# Patient Record
Sex: Female | Born: 1970 | Race: White | Hispanic: No | Marital: Single | Smoking: Former smoker
Health system: Southern US, Community
[De-identification: ages and names within clinical notes are randomized; demographics above are authoritative.]

## PROBLEM LIST (undated history)

## (undated) DIAGNOSIS — I10 Essential (primary) hypertension: Secondary | ICD-10-CM

## (undated) DIAGNOSIS — F988 Other specified behavioral and emotional disorders with onset usually occurring in childhood and adolescence: Secondary | ICD-10-CM

## (undated) DIAGNOSIS — N938 Other specified abnormal uterine and vaginal bleeding: Secondary | ICD-10-CM

## (undated) DIAGNOSIS — N946 Dysmenorrhea, unspecified: Secondary | ICD-10-CM

## (undated) DIAGNOSIS — F32A Depression, unspecified: Secondary | ICD-10-CM

## (undated) DIAGNOSIS — G2581 Restless legs syndrome: Secondary | ICD-10-CM

## (undated) DIAGNOSIS — R42 Dizziness and giddiness: Secondary | ICD-10-CM

## (undated) DIAGNOSIS — I614 Nontraumatic intracerebral hemorrhage in cerebellum: Secondary | ICD-10-CM

## (undated) DIAGNOSIS — M25559 Pain in unspecified hip: Secondary | ICD-10-CM

## (undated) DIAGNOSIS — F39 Unspecified mood [affective] disorder: Secondary | ICD-10-CM

## (undated) DIAGNOSIS — F419 Anxiety disorder, unspecified: Secondary | ICD-10-CM

## (undated) DIAGNOSIS — G5600 Carpal tunnel syndrome, unspecified upper limb: Secondary | ICD-10-CM

## (undated) DIAGNOSIS — F329 Major depressive disorder, single episode, unspecified: Secondary | ICD-10-CM

## (undated) DIAGNOSIS — M2550 Pain in unspecified joint: Secondary | ICD-10-CM

## (undated) DIAGNOSIS — E7800 Pure hypercholesterolemia, unspecified: Secondary | ICD-10-CM

## (undated) HISTORY — DX: Restless legs syndrome: G25.81

## (undated) HISTORY — PX: BRAIN SURGERY: SHX531

## (undated) HISTORY — DX: Pain in unspecified hip: M25.559

## (undated) HISTORY — DX: Pain in unspecified joint: M25.50

## (undated) HISTORY — DX: Other specified behavioral and emotional disorders with onset usually occurring in childhood and adolescence: F98.8

## (undated) HISTORY — DX: Carpal tunnel syndrome, unspecified upper limb: G56.00

## (undated) HISTORY — DX: Unspecified mood (affective) disorder: F39

## (undated) HISTORY — DX: Dysmenorrhea, unspecified: N94.6

## (undated) HISTORY — DX: Other specified abnormal uterine and vaginal bleeding: N93.8

---

## 2004-12-30 ENCOUNTER — Inpatient Hospital Stay (HOSPITAL_COMMUNITY): Admission: AD | Admit: 2004-12-30 | Discharge: 2005-01-05 | Payer: Self-pay | Admitting: Neurological Surgery

## 2004-12-31 ENCOUNTER — Ambulatory Visit: Payer: Self-pay | Admitting: Oncology

## 2005-02-14 ENCOUNTER — Ambulatory Visit (HOSPITAL_COMMUNITY): Admission: RE | Admit: 2005-02-14 | Discharge: 2005-02-14 | Payer: Self-pay | Admitting: Neurological Surgery

## 2012-03-20 ENCOUNTER — Encounter (HOSPITAL_COMMUNITY): Payer: Self-pay | Admitting: *Deleted

## 2012-03-20 ENCOUNTER — Inpatient Hospital Stay (HOSPITAL_COMMUNITY)
Admission: EM | Admit: 2012-03-20 | Discharge: 2012-03-22 | DRG: 395 | Disposition: A | Discharge status: Home / Self Care | Payer: MEDICAID | Attending: Internal Medicine | Admitting: Internal Medicine

## 2012-03-20 ENCOUNTER — Emergency Department (HOSPITAL_COMMUNITY): Payer: Self-pay

## 2012-03-20 DIAGNOSIS — I10 Essential (primary) hypertension: Secondary | ICD-10-CM | POA: Diagnosis present

## 2012-03-20 DIAGNOSIS — Z23 Encounter for immunization: Secondary | ICD-10-CM

## 2012-03-20 DIAGNOSIS — F3289 Other specified depressive episodes: Secondary | ICD-10-CM | POA: Diagnosis present

## 2012-03-20 DIAGNOSIS — F329 Major depressive disorder, single episode, unspecified: Secondary | ICD-10-CM | POA: Diagnosis present

## 2012-03-20 DIAGNOSIS — F17200 Nicotine dependence, unspecified, uncomplicated: Secondary | ICD-10-CM | POA: Diagnosis present

## 2012-03-20 DIAGNOSIS — E669 Obesity, unspecified: Secondary | ICD-10-CM | POA: Diagnosis present

## 2012-03-20 DIAGNOSIS — E876 Hypokalemia: Secondary | ICD-10-CM | POA: Diagnosis present

## 2012-03-20 DIAGNOSIS — K529 Noninfective gastroenteritis and colitis, unspecified: Secondary | ICD-10-CM | POA: Diagnosis present

## 2012-03-20 DIAGNOSIS — R42 Dizziness and giddiness: Secondary | ICD-10-CM | POA: Diagnosis present

## 2012-03-20 DIAGNOSIS — E7800 Pure hypercholesterolemia, unspecified: Secondary | ICD-10-CM | POA: Diagnosis present

## 2012-03-20 DIAGNOSIS — F411 Generalized anxiety disorder: Secondary | ICD-10-CM | POA: Diagnosis present

## 2012-03-20 DIAGNOSIS — F419 Anxiety disorder, unspecified: Secondary | ICD-10-CM | POA: Diagnosis present

## 2012-03-20 DIAGNOSIS — K625 Hemorrhage of anus and rectum: Secondary | ICD-10-CM | POA: Diagnosis present

## 2012-03-20 DIAGNOSIS — Z79899 Other long term (current) drug therapy: Secondary | ICD-10-CM

## 2012-03-20 DIAGNOSIS — K5289 Other specified noninfective gastroenteritis and colitis: Secondary | ICD-10-CM

## 2012-03-20 DIAGNOSIS — K559 Vascular disorder of intestine, unspecified: Principal | ICD-10-CM | POA: Diagnosis present

## 2012-03-20 DIAGNOSIS — Z6837 Body mass index (BMI) 37.0-37.9, adult: Secondary | ICD-10-CM

## 2012-03-20 HISTORY — DX: Depression, unspecified: F32.A

## 2012-03-20 HISTORY — DX: Essential (primary) hypertension: I10

## 2012-03-20 HISTORY — DX: Dizziness and giddiness: R42

## 2012-03-20 HISTORY — DX: Anxiety disorder, unspecified: F41.9

## 2012-03-20 HISTORY — DX: Pure hypercholesterolemia, unspecified: E78.00

## 2012-03-20 HISTORY — DX: Nontraumatic intracerebral hemorrhage in cerebellum: I61.4

## 2012-03-20 HISTORY — DX: Major depressive disorder, single episode, unspecified: F32.9

## 2012-03-20 LAB — CBC WITH DIFFERENTIAL/PLATELET
Basophils Absolute: 0 10*3/uL (ref 0.0–0.1)
Basophils Relative: 0 % (ref 0–1)
Eosinophils Absolute: 0.1 10*3/uL (ref 0.0–0.7)
Eosinophils Relative: 1 % (ref 0–5)
HCT: 41.1 % (ref 36.0–46.0)
Hemoglobin: 13.9 g/dL (ref 12.0–15.0)
Lymphocytes Relative: 13 % (ref 12–46)
Lymphs Abs: 1.5 10*3/uL (ref 0.7–4.0)
MCH: 32.3 pg (ref 26.0–34.0)
MCHC: 33.8 g/dL (ref 30.0–36.0)
MCV: 95.4 fL (ref 78.0–100.0)
Monocytes Absolute: 0.6 10*3/uL (ref 0.1–1.0)
Monocytes Relative: 5 % (ref 3–12)
Neutro Abs: 9.1 10*3/uL — ABNORMAL HIGH (ref 1.7–7.7)
Neutrophils Relative %: 81 % — ABNORMAL HIGH (ref 43–77)
Platelets: 371 10*3/uL (ref 150–400)
RBC: 4.31 MIL/uL (ref 3.87–5.11)
RDW: 12.1 % (ref 11.5–15.5)
WBC: 11.2 10*3/uL — ABNORMAL HIGH (ref 4.0–10.5)

## 2012-03-20 LAB — COMPREHENSIVE METABOLIC PANEL
ALT: 24 U/L (ref 0–35)
AST: 21 U/L (ref 0–37)
Albumin: 3.7 g/dL (ref 3.5–5.2)
Alkaline Phosphatase: 56 U/L (ref 39–117)
BUN: 10 mg/dL (ref 6–23)
CO2: 26 mEq/L (ref 19–32)
Calcium: 9.3 mg/dL (ref 8.4–10.5)
Chloride: 99 mEq/L (ref 96–112)
Creatinine, Ser: 0.8 mg/dL (ref 0.50–1.10)
GFR calc Af Amer: >90 mL/min (ref >90)
GFR calc non Af Amer: 90 mL/min — ABNORMAL LOW (ref >90)
Glucose, Bld: 96 mg/dL (ref 70–99)
Potassium: 3.1 mEq/L — ABNORMAL LOW (ref 3.5–5.1)
Sodium: 136 mEq/L (ref 135–145)
Total Bilirubin: 0.3 mg/dL (ref 0.3–1.2)
Total Protein: 7.4 g/dL (ref 6.0–8.3)

## 2012-03-20 LAB — URINALYSIS, ROUTINE W REFLEX MICROSCOPIC
Bilirubin Urine: NEGATIVE
Glucose, UA: NEGATIVE mg/dL
Hgb urine dipstick: NEGATIVE
Ketones, ur: NEGATIVE mg/dL
Leukocytes, UA: NEGATIVE
Nitrite: NEGATIVE
Protein, ur: NEGATIVE mg/dL
Specific Gravity, Urine: 1.015 (ref 1.005–1.030)
Urobilinogen, UA: 0.2 mg/dL (ref 0.0–1.0)
pH: 6 (ref 5.0–8.0)

## 2012-03-20 LAB — LIPASE, BLOOD: Lipase: 24 U/L (ref 11–59)

## 2012-03-20 LAB — SAMPLE TO BLOOD BANK

## 2012-03-20 LAB — POCT PREGNANCY, URINE: Preg Test, Ur: NEGATIVE

## 2012-03-20 MED ORDER — NORTRIPTYLINE HCL 25 MG PO CAPS
50.0000 mg | ORAL_CAPSULE | Freq: Every day | ORAL | Status: DC
  Filled 2012-03-20: qty 2

## 2012-03-20 MED ORDER — SODIUM CHLORIDE 0.9 % IV SOLN
1000.0000 mL | INTRAVENOUS | Status: DC
  Administered 2012-03-20: 1000 mL via INTRAVENOUS

## 2012-03-20 MED ORDER — SODIUM CHLORIDE 0.9 % IV SOLN: INTRAVENOUS | Status: DC

## 2012-03-20 MED ORDER — LISINOPRIL 10 MG PO TABS
10.0000 mg | ORAL_TABLET | Freq: Every day | ORAL | Status: DC
  Administered 2012-03-21 – 2012-03-22 (×2): 10 mg via ORAL
  Filled 2012-03-20 (×2): qty 1

## 2012-03-20 MED ORDER — INFLUENZA VIRUS VACC SPLIT PF IM SUSP
0.5000 mL | INTRAMUSCULAR | Status: AC
  Administered 2012-03-21: 0.5 mL via INTRAMUSCULAR
  Filled 2012-03-20: qty 0.5

## 2012-03-20 MED ORDER — CIPROFLOXACIN IN D5W 400 MG/200ML IV SOLN
400.0000 mg | Freq: Two times a day (BID) | INTRAVENOUS | Status: DC
  Administered 2012-03-20 – 2012-03-21 (×2): 400 mg via INTRAVENOUS
  Filled 2012-03-20 (×9): qty 200

## 2012-03-20 MED ORDER — METRONIDAZOLE IN NACL 5-0.79 MG/ML-% IV SOLN: 500.0000 mg | Freq: Two times a day (BID) | INTRAVENOUS | Status: DC

## 2012-03-20 MED ORDER — ONDANSETRON HCL 4 MG/2ML IJ SOLN: 4.0000 mg | INTRAMUSCULAR | Status: DC | PRN

## 2012-03-20 MED ORDER — PNEUMOCOCCAL VAC POLYVALENT 25 MCG/0.5ML IJ INJ
0.5000 mL | INJECTION | INTRAMUSCULAR | Status: AC
Start: 2012-03-21 — End: 2012-03-21
  Administered 2012-03-21: 0.5 mL via INTRAMUSCULAR
  Filled 2012-03-20: qty 0.5

## 2012-03-20 MED ORDER — POTASSIUM CHLORIDE IN NACL 20-0.9 MEQ/L-% IV SOLN
INTRAVENOUS | Status: DC
  Administered 2012-03-20 – 2012-03-21 (×2): via INTRAVENOUS

## 2012-03-20 MED ORDER — CIPROFLOXACIN IN D5W 400 MG/200ML IV SOLN: 400.0000 mg | Freq: Two times a day (BID) | INTRAVENOUS | Status: DC

## 2012-03-20 MED ORDER — MECLIZINE HCL 12.5 MG PO TABS: 25.0000 mg | ORAL_TABLET | Freq: Three times a day (TID) | ORAL | Status: DC | PRN

## 2012-03-20 MED ORDER — ACETAMINOPHEN 650 MG RE SUPP: 650.0000 mg | Freq: Four times a day (QID) | RECTAL | Status: DC | PRN

## 2012-03-20 MED ORDER — CIPROFLOXACIN IN D5W 400 MG/200ML IV SOLN
INTRAVENOUS | Status: AC
  Filled 2012-03-20: qty 200

## 2012-03-20 MED ORDER — ACETAMINOPHEN 325 MG PO TABS: 650.0000 mg | ORAL_TABLET | Freq: Four times a day (QID) | ORAL | Status: DC | PRN

## 2012-03-20 MED ORDER — MORPHINE SULFATE 4 MG/ML IJ SOLN
4.0000 mg | Freq: Once | INTRAMUSCULAR | Status: AC
  Administered 2012-03-20: 4 mg via INTRAVENOUS
  Filled 2012-03-20: qty 1

## 2012-03-20 MED ORDER — HYDROCODONE-ACETAMINOPHEN 5-325 MG PO TABS
1.0000 | ORAL_TABLET | ORAL | Status: DC | PRN
  Administered 2012-03-20 – 2012-03-22 (×5): 1 via ORAL
  Filled 2012-03-20 (×5): qty 1

## 2012-03-20 MED ORDER — METRONIDAZOLE IN NACL 5-0.79 MG/ML-% IV SOLN
500.0000 mg | Freq: Three times a day (TID) | INTRAVENOUS | Status: DC
  Administered 2012-03-20 – 2012-03-21 (×2): 500 mg via INTRAVENOUS
  Filled 2012-03-20 (×12): qty 100

## 2012-03-20 MED ORDER — METRONIDAZOLE IN NACL 5-0.79 MG/ML-% IV SOLN
INTRAVENOUS | Status: AC
  Filled 2012-03-20: qty 200

## 2012-03-20 MED ORDER — IOHEXOL 300 MG/ML  SOLN
100.0000 mL | Freq: Once | INTRAMUSCULAR | Status: AC | PRN
  Administered 2012-03-20: 100 mL via INTRAVENOUS

## 2012-03-20 MED ORDER — ONDANSETRON HCL 4 MG PO TABS: 4.0000 mg | ORAL_TABLET | Freq: Four times a day (QID) | ORAL | Status: DC | PRN

## 2012-03-20 MED ORDER — ALPRAZOLAM 1 MG PO TABS
1.0000 mg | ORAL_TABLET | Freq: Four times a day (QID) | ORAL | Status: DC
  Administered 2012-03-20 – 2012-03-22 (×7): 1 mg via ORAL
  Filled 2012-03-20 (×7): qty 1

## 2012-03-20 MED ORDER — MORPHINE SULFATE 4 MG/ML IJ SOLN
4.0000 mg | INTRAMUSCULAR | Status: DC | PRN
  Administered 2012-03-20: 4 mg via INTRAVENOUS
  Filled 2012-03-20: qty 1

## 2012-03-20 MED ORDER — ONDANSETRON HCL 4 MG/2ML IJ SOLN: 4.0000 mg | Freq: Four times a day (QID) | INTRAMUSCULAR | Status: DC | PRN

## 2012-03-20 MED ORDER — ONDANSETRON HCL 4 MG/2ML IJ SOLN
4.0000 mg | Freq: Once | INTRAMUSCULAR | Status: AC
  Administered 2012-03-20: 4 mg via INTRAVENOUS
  Filled 2012-03-20: qty 2

## 2012-03-20 MED ORDER — NORTRIPTYLINE HCL 25 MG PO CAPS
100.0000 mg | ORAL_CAPSULE | Freq: Every day | ORAL | Status: DC
  Administered 2012-03-20 – 2012-03-21 (×2): 100 mg via ORAL
  Filled 2012-03-20 (×2): qty 4

## 2012-03-20 NOTE — H&P (Signed)
[facility] History and Physical  Annalyce Lanpher YQM:[id number] DOB: [birth year] DOA: 03/20/2012  Referring physician: Dr. Ignacia Palma PCP: Remus Loffler, PA  Specialists:   Chief Complaint: rectal bleeding  HPI: Theresa Mullins is a 41 y.o. female  history of prior intracranial bleeding status post craniotomy in the past. Patient was in her usual state of health when she developed onset of loose stools and associated blood per rectum. She describes it as bright red blood. She was having multiple bowel movements every day and was taking Imodium. Her symptoms progressively got worse. Today she noticed that her bleeding had progressed and she was noticing just a blood in her bowel movement. She has chronic vertigo but does feel slightly more lightheaded. She does not have any chest pain shortness of breath. She does have some lower abdominal pain which somewhat improves with bowel movement. She's not had any recent antibiotics. She does not have any fever. She does not have any sick contacts. She does take BC powders for pain. This is approximately twice a week.  Review of Systems: Pertinent positives as per history of present illness, otherwise negative  Past Medical History  Diagnosis Date  . Cerebellar bleed   . Hypertension   . Depression   . Anxiety   . High cholesterol   . Vertigo    Past Surgical History  Procedure Date  . Brain surgery    Social History:  reports that she has been smoking.  She has never used smokeless tobacco. She reports that she does not drink alcohol or use illicit drugs. Lives at home with her boyfriend, independent with ADLs.  Allergies  Allergen Reactions  . Remeron (Mirtazapine)     Elevated blood pressure   Family history: No family history of inflammatory bowel disease.  Prior to Admission medications   Medication Sig Start Date End Date Taking? Authorizing Provider  ALPRAZolam Prudy Feeler) 1 MG tablet Take 1 mg by mouth 4 (four) times daily.   Yes  Historical Provider, MD  ferrous sulfate 325 (65 FE) MG tablet Take 325 mg by mouth at bedtime.   Yes Historical Provider, MD  gemfibrozil (LOPID) 600 MG tablet Take 600 mg by mouth 2 (two) times daily.   Yes Historical Provider, MD  HYDROcodone-acetaminophen (VICODIN) 5-500 MG per tablet Take 1 tablet by mouth 3 (three) times daily as needed. pain   Yes Historical Provider, MD  lisinopril (PRINIVIL,ZESTRIL) 10 MG tablet Take 10 mg by mouth daily.   Yes Historical Provider, MD  meclizine (ANTIVERT) 25 MG tablet Take 25 mg by mouth 3 (three) times daily as needed. dizziness   Yes Historical Provider, MD  Multiple Vitamin (MULTIVITAMIN) tablet Take 1 tablet by mouth daily.   Yes Historical Provider, MD  nortriptyline (PAMELOR) 50 MG capsule Take 50-100 mg by mouth 2 (two) times daily. Patient takes 1 capsule in the morning and 2 capsules at night   Yes Historical Provider, MD   Physical Exam: Filed Vitals:   03/20/12 1325 03/20/12 1403 03/20/12 1611  BP: 144/96 148/97 149/89  Pulse: 108 94 91  Temp: 97.9 F (36.6 C)    Resp: 18 18   Height: 4\' 7"  (1.397 m)    Weight: 72.576 kg (160 lb)    SpO2: 100% 97% 100%     General: No acute distress  Eyes: Pupils are equal round react to light  ENT: Mucous membranes are moist  Neck: Supple  Cardiovascular: S1, S2, regular rate and rhythm, no pedal edema  Respiratory:  Clear to auscultation bilaterally  Abdomen: Soft, tender in the lower abdomen, bowel sounds are active  Skin: Normal  Musculoskeletal: Deferred  Psychiatric: Normal affect, cooperative with exam  Neurologic: Grossly intact, nonfocal  Labs on Admission:  Basic Metabolic Panel:  Lab 03/20/12 1610  NA 136  K 3.1*  CL 99  CO2 26  GLUCOSE 96  BUN 10  CREATININE 0.80  CALCIUM 9.3  MG --  PHOS --   Liver Function Tests:  Lab 03/20/12 1536  AST 21  ALT 24  ALKPHOS 56  BILITOT 0.3  PROT 7.4  ALBUMIN 3.7    Lab 03/20/12 1536  LIPASE 24  AMYLASE --   No  results found for this basename: AMMONIA:5 in the last 168 hours CBC:  Lab 03/20/12 1536  WBC 11.2*  NEUTROABS 9.1*  HGB 13.9  HCT 41.1  MCV 95.4  PLT 371   Cardiac Enzymes: No results found for this basename: CKTOTAL:5,CKMB:5,CKMBINDEX:5,TROPONINI:5 in the last 168 hours  BNP (last 3 results) No results found for this basename: PROBNP:3 in the last 8760 hours CBG: No results found for this basename: GLUCAP:5 in the last 168 hours  Radiological Exams on Admission: Ct Abdomen Pelvis W Contrast  03/20/2012  *RADIOLOGY REPORT*  Clinical Data: 41 year old female with abdominal and pelvic pain and rectal bleeding.  CT ABDOMEN AND PELVIS WITH CONTRAST  Technique:  Multidetector CT imaging of the abdomen and pelvis was performed following the standard protocol during bolus administration of intravenous contrast.  Contrast: OMNIPAQUE IOHEXOL 300 MG/ML  SOLN  Comparison: 12/31/2004 CT  Findings: The liver, spleen, pancreas, gallbladder, adrenal glands and kidneys are unremarkable.  There is diffuse circumferential wall thickening of the entire sigmoid colon with adjacent inflammation - compatible with colitis. No other bowel abnormalities are identified. The appendix is normal.  No free fluid, enlarged lymph nodes, biliary dilation or abdominal aortic aneurysm identified. There is no evidence of abscess, pneumoperitoneum or bowel obstruction.  The bladder is within normal limits. The uterus and ovaries are unremarkable.  No acute or suspicious bony abnormalities are identified.  IMPRESSION: Colitis of the entire sigmoid colon - likely inflammatory or infectious.  No evidence of pneumoperitoneum, abscess or bowel obstruction.   Original Report Authenticated By: Harmon Pier, M.D.       Assessment/Plan Principal Problem:  *Colitis Active Problems:  Rectal bleeding  Hypokalemia  Hypertension  Anxiety   1. Colitis. Presumed infectious. We will start the patient on ciprofloxacin and  Flagyl. Send for stool cultures as well as Clostridium difficile PCR. She was placed on contact precautions. We will start her on a low fiber diet. 2. Rectal bleeding. Likely secondary to colitis. We will request a GI consultation to see need for flexible sigmoidoscopy. 3. Hypokalemia. Replace. Likely secondary to GI losses. 4. Hypertension. Continue lisinopril. 5. Anxiety. Continue outpatient dose of Xanax.  Code Status: Full code Family Communication: Discussed with patient, no family present Disposition Plan: Discharge home once medically improved  Time spent: 45 minutes  MEMON,JEHANZEB [facility] Pager [phone]  If 7PM-7AM, please contact night-coverage [email or web address] Password [facility] 03/20/2012, 5:49 PM

## 2012-03-20 NOTE — ED Provider Notes (Signed)
History     CSN: [id number]  Arrival date & time 03/20/12  1315   First MD Initiated Contact with Patient 03/20/12 1328      Chief Complaint  Patient presents with  . Rectal Bleeding    (Consider location/radiation/quality/duration/timing/severity/associated sxs/prior treatment) Patient is a 41 y.o. female presenting with hematochezia. The history is provided by the patient. No language interpreter was used.  Rectal Bleeding  The current episode started yesterday. The onset was gradual. Episode frequency: Pt had cramping pain in the left lower abdomen and diarrhea starting yesterday, took Imodium without relief, developed bright red rectal bleeding yesterday evening that persists today. The stool is described as liquid and bloody. There was no prior successful therapy. Ineffective treatments: She took Imodium without relief. Associated symptoms include abdominal pain, diarrhea and coughing. Pertinent negatives include no hematemesis. Associated symptoms comments: Hot and cold chills, did not measure temperature.. Past medical history comments: None. She has received no recent medical care.    Past Medical History  Diagnosis Date  . Cerebellar bleed   . Hypertension   . Depression   . Anxiety   . High cholesterol     Past Surgical History  Procedure Date  . Brain surgery     History reviewed. No pertinent family history.  History  Substance Use Topics  . Smoking status: Current Every Day Smoker  . Smokeless tobacco: Not on file  . Alcohol Use: No    OB History    Grav Para Term Preterm Abortions TAB SAB Ect Mult Living                  Review of Systems  Unable to perform ROS Constitutional: Positive for chills.  HENT: Negative.   Eyes: Negative.   Respiratory: Positive for cough.   Cardiovascular: Negative.   Gastrointestinal: Positive for abdominal pain, diarrhea and hematochezia. Negative for hematemesis.  Genitourinary: Negative.        Is a week late on  her menstrual period.  Skin: Negative.   Neurological: Negative.   Psychiatric/Behavioral: Negative.     Allergies  Remeron  Home Medications   Current Outpatient Rx  Name  Route  Sig  Dispense  Refill  . ALPRAZOLAM 1 MG PO TABS   Oral   Take 1 mg by mouth 4 (four) times daily.         Marland Kitchen FERROUS SULFATE 325 (65 FE) MG PO TABS   Oral   Take 325 mg by mouth at bedtime.         Marland Kitchen GEMFIBROZIL 600 MG PO TABS   Oral   Take 600 mg by mouth 2 (two) times daily.         Marland Kitchen HYDROCODONE-ACETAMINOPHEN 5-500 MG PO TABS   Oral   Take 1 tablet by mouth 3 (three) times daily as needed. pain         . LISINOPRIL 10 MG PO TABS   Oral   Take 10 mg by mouth daily.         Marland Kitchen ONE-DAILY MULTI VITAMINS PO TABS   Oral   Take 1 tablet by mouth daily.         Marland Kitchen NORTRIPTYLINE HCL 50 MG PO CAPS   Oral   Take 50-100 mg by mouth 2 (two) times daily. Patient takes 1 capsule in the morning and 2 capsules at night           BP 144/96  Pulse 108  Temp 97.9 F (36.6 C)  Resp 18  Ht 4\' 7"  (1.397 m)  Wt 160 lb (72.576 kg)  BMI 37.19 kg/m2  SpO2 100%  LMP 02/05/2012  Physical Exam  ED Course  Procedures (including critical care time)   Labs Reviewed  COMPREHENSIVE METABOLIC PANEL  LIPASE, BLOOD  URINALYSIS, ROUTINE W REFLEX MICROSCOPIC  PREGNANCY, URINE  CBC WITH DIFFERENTIAL  SAMPLE TO BLOOD BANK   2:00 PM Pt seen --> physical exam performed.  Lab workup ordered.    3:43 PM No blood work results because specimens had not been taken to lab.  RN taking specimens to lab now.  4:50 PM Results for orders placed during the hospital encounter of 03/20/12  COMPREHENSIVE METABOLIC PANEL      Component Value Range   Sodium 136  135 - 145 mEq/L   Potassium 3.1 (*) 3.5 - 5.1 mEq/L   Chloride 99  96 - 112 mEq/L   CO2 26  19 - 32 mEq/L   Glucose, Bld 96  70 - 99 mg/dL   BUN 10  6 - 23 mg/dL   Creatinine, Ser 0.86  0.50 - 1.10 mg/dL   Calcium 9.3  8.4 - 57.8 mg/dL    Total Protein 7.4  6.0 - 8.3 g/dL   Albumin 3.7  3.5 - 5.2 g/dL   AST 21  0 - 37 U/L   ALT 24  0 - 35 U/L   Alkaline Phosphatase 56  39 - 117 U/L   Total Bilirubin 0.3  0.3 - 1.2 mg/dL   GFR calc non Af Amer 90 (*) >90 mL/min   GFR calc Af Amer >90  >90 mL/min  LIPASE, BLOOD      Component Value Range   Lipase 24  11 - 59 U/L  URINALYSIS, ROUTINE W REFLEX MICROSCOPIC      Component Value Range   Color, Urine YELLOW  YELLOW   APPearance CLEAR  CLEAR   Specific Gravity, Urine 1.015  1.005 - 1.030   pH 6.0  5.0 - 8.0   Glucose, UA NEGATIVE  NEGATIVE mg/dL   Hgb urine dipstick NEGATIVE  NEGATIVE   Bilirubin Urine NEGATIVE  NEGATIVE   Ketones, ur NEGATIVE  NEGATIVE mg/dL   Protein, ur NEGATIVE  NEGATIVE mg/dL   Urobilinogen, UA 0.2  0.0 - 1.0 mg/dL   Nitrite NEGATIVE  NEGATIVE   Leukocytes, UA NEGATIVE  NEGATIVE  CBC WITH DIFFERENTIAL      Component Value Range   WBC 11.2 (*) 4.0 - 10.5 K/uL   RBC 4.31  3.87 - 5.11 MIL/uL   Hemoglobin 13.9  12.0 - 15.0 g/dL   HCT 46.9  62.9 - 52.8 %   MCV 95.4  78.0 - 100.0 fL   MCH 32.3  26.0 - 34.0 pg   MCHC 33.8  30.0 - 36.0 g/dL   RDW 41.3  24.4 - 01.0 %   Platelets 371  150 - 400 K/uL   Neutrophils Relative 81 (*) 43 - 77 %   Neutro Abs 9.1 (*) 1.7 - 7.7 K/uL   Lymphocytes Relative 13  12 - 46 %   Lymphs Abs 1.5  0.7 - 4.0 K/uL   Monocytes Relative 5  3 - 12 %   Monocytes Absolute 0.6  0.1 - 1.0 K/uL   Eosinophils Relative 1  0 - 5 %   Eosinophils Absolute 0.1  0.0 - 0.7 K/uL   Basophils Relative 0  0 - 1 %   Basophils Absolute 0.0  0.0 - 0.1  K/uL  SAMPLE TO BLOOD BANK      Component Value Range   Blood Bank Specimen SAMPLE AVAILABLE FOR TESTING     Sample Expiration 03/23/2012    POCT PREGNANCY, URINE      Component Value Range   Preg Test, Ur NEGATIVE  NEGATIVE   Ct Abdomen Pelvis W Contrast  03/20/2012  *RADIOLOGY REPORT*  Clinical Data: 41 year old female with abdominal and pelvic pain and rectal bleeding.  CT ABDOMEN AND  PELVIS WITH CONTRAST  Technique:  Multidetector CT imaging of the abdomen and pelvis was performed following the standard protocol during bolus administration of intravenous contrast.  Contrast: OMNIPAQUE IOHEXOL 300 MG/ML  SOLN  Comparison: 12/31/2004 CT  Findings: The liver, spleen, pancreas, gallbladder, adrenal glands and kidneys are unremarkable.  There is diffuse circumferential wall thickening of the entire sigmoid colon with adjacent inflammation - compatible with colitis. No other bowel abnormalities are identified. The appendix is normal.  No free fluid, enlarged lymph nodes, biliary dilation or abdominal aortic aneurysm identified. There is no evidence of abscess, pneumoperitoneum or bowel obstruction.  The bladder is within normal limits. The uterus and ovaries are unremarkable.  No acute or suspicious bony abnormalities are identified.  IMPRESSION: Colitis of the entire sigmoid colon - likely inflammatory or infectious.  No evidence of pneumoperitoneum, abscess or bowel obstruction.   Original Report Authenticated By: Harmon Pier, M.D.     CT of abdomen/pelfis shows colitis in the sigmoid colon.  She continues with LLQ pain.  Recommend admission for IV antibiotics.  5:21 PM Discussed with Dr. Kerry Hough --> admit to [facility] to Team 2 to a med-surg bed.   1. Colitis, acute            Carleene Cooper III, MD 03/20/12 1726

## 2012-03-20 NOTE — ED Notes (Signed)
Pt c/o n/v/d that started on Tuesday, has been taking imodium for the diarrhea, states that she noticed "light" blood in her stools and then the bleeding became worse, pt states that she is bleeding like "she is on her period", when she tries to have a bowel movement, denies any previous problems of rectal bleeding.

## 2012-03-21 DIAGNOSIS — F411 Generalized anxiety disorder: Secondary | ICD-10-CM

## 2012-03-21 LAB — COMPREHENSIVE METABOLIC PANEL
ALT: 17 U/L (ref 0–35)
AST: 15 U/L (ref 0–37)
Albumin: 3.1 g/dL — ABNORMAL LOW (ref 3.5–5.2)
Alkaline Phosphatase: 45 U/L (ref 39–117)
Chloride: 102 mEq/L (ref 96–112)
Potassium: 3.4 mEq/L — ABNORMAL LOW (ref 3.5–5.1)
Sodium: 137 mEq/L (ref 135–145)
Total Bilirubin: 0.2 mg/dL — ABNORMAL LOW (ref 0.3–1.2)
Total Protein: 6.1 g/dL (ref 6.0–8.3)

## 2012-03-21 LAB — CBC
HCT: 35.7 % — ABNORMAL LOW (ref 36.0–46.0)
MCH: 32.8 pg (ref 26.0–34.0)
MCHC: 33.9 g/dL (ref 30.0–36.0)
MCV: 96.7 fL (ref 78.0–100.0)
Platelets: 289 10*3/uL (ref 150–400)
RDW: 12.2 % (ref 11.5–15.5)
WBC: 7.6 10*3/uL (ref 4.0–10.5)

## 2012-03-21 MED ORDER — METRONIDAZOLE 500 MG PO TABS
500.0000 mg | ORAL_TABLET | Freq: Three times a day (TID) | ORAL | Status: DC
  Administered 2012-03-21 – 2012-03-22 (×3): 500 mg via ORAL
  Filled 2012-03-21 (×3): qty 1

## 2012-03-21 MED ORDER — POTASSIUM CHLORIDE CRYS ER 20 MEQ PO TBCR
40.0000 meq | EXTENDED_RELEASE_TABLET | Freq: Once | ORAL | Status: AC
  Administered 2012-03-21: 40 meq via ORAL
  Filled 2012-03-21: qty 1

## 2012-03-21 MED ORDER — DIPHENHYDRAMINE HCL 25 MG PO CAPS
50.0000 mg | ORAL_CAPSULE | Freq: Four times a day (QID) | ORAL | Status: DC | PRN
  Administered 2012-03-21 – 2012-03-22 (×2): 50 mg via ORAL
  Filled 2012-03-21 (×2): qty 2

## 2012-03-21 MED ORDER — CIPROFLOXACIN HCL 250 MG PO TABS
500.0000 mg | ORAL_TABLET | Freq: Two times a day (BID) | ORAL | Status: DC
  Administered 2012-03-21 – 2012-03-22 (×2): 500 mg via ORAL
  Filled 2012-03-21 (×2): qty 2

## 2012-03-21 NOTE — Progress Notes (Signed)
Patient c/o of itching, rash was noted on patient's front, back and upper thighs, patient states that she received flu and pneumonia vaccine today, called and spoke with Dr. Orvan Falconer, new orders given for Benadryl

## 2012-03-21 NOTE — Progress Notes (Signed)
UR Chart Review Completed  

## 2012-03-21 NOTE — Care Management Note (Unsigned)
    Page 1 of 1   03/21/2012     2:09:08 PM   CARE MANAGEMENT NOTE 03/21/2012  Patient:  LAPORSHA, HAFELE   Account Number:  [id number]  Date Initiated:  03/21/2012  Documentation initiated by:  Sharrie Rothman  Subjective/Objective Assessment:   Pt admitted from home with colitis. Pt lives with her boyfriend and will return home with him at discharge. Pt stated that she follows up with Prudy Feeler PA of [facility] in [place]. Pt stated that her boyfriend buys her med.     Action/Plan:   [facility] since she does work a part time job. No CM or HH needs noted.   Anticipated DC Date:  03/24/2012   Anticipated DC Plan:  HOME/SELF CARE      DC Planning Services  CM consult      Choice offered to / List presented to:             Status of service:  Completed, signed off Medicare Important Message given?   (If response is "NO", the following Medicare IM given date fields will be blank) Date Medicare IM given:   Date Additional Medicare IM given:    Discharge Disposition:  HOME/SELF CARE  Per UR Regulation:    If discussed at Long Length of Stay Meetings, dates discussed:    Comments:  03/21/12 1410 Arlyss Queen, RN BSN CM

## 2012-03-21 NOTE — Progress Notes (Signed)
[facility]             Progress Note   Subjective: Patient feels significantly better today, abd pain improving, less blood in stools  Objective: Vital signs in last 24 hours: Temp:  [97.7 F (36.5 C)-98.2 F (36.8 C)] 98.2 F (36.8 C) (11/29 0501) Pulse Rate:  [79-100] 79  (11/29 0501) Resp:  [18] 18  (11/29 0501) BP: (107-179)/(75-100) 107/75 mmHg (11/29 0501) SpO2:  [97 %-100 %] 98 % (11/29 0501) Weight:  [72.576 kg (160 lb)] 72.576 kg (160 lb) (11/28 1837) Weight change:  Last BM Date: 03/20/12  Intake/Output from previous day: 11/28 0701 - 11/29 0700 In: -  Out: 400 [Urine:400]     Physical Exam: General: Alert, awake, oriented x3, in no acute distress. HEENT: No bruits, no goiter. Heart: Regular rate and rhythm, without murmurs, rubs, gallops. Lungs: Clear to auscultation bilaterally. Abdomen: soft, tender in LLQ, bs+ Extremities: No clubbing cyanosis or edema with positive pedal pulses. Neuro: Grossly intact, nonfocal.    Lab Results: Basic Metabolic Panel:  Basename 03/21/12 0554 03/20/12 1536  NA 137 136  K 3.4* 3.1*  CL 102 99  CO2 27 26  GLUCOSE 95 96  BUN 4* 10  CREATININE 0.53 0.80  CALCIUM 8.3* 9.3  MG -- --  PHOS -- --   Liver Function Tests:  [facility] 03/21/12 0554 03/20/12 1536  AST 15 21  ALT 17 24  ALKPHOS 45 56  BILITOT 0.2* 0.3  PROT 6.1 7.4  ALBUMIN 3.1* 3.7    Basename 03/20/12 1536  LIPASE 24  AMYLASE --   No results found for this basename: AMMONIA:2 in the last 72 hours CBC:  Basename 03/21/12 0554 03/20/12 1536  WBC 7.6 11.2*  NEUTROABS -- 9.1*  HGB 12.1 13.9  HCT 35.7* 41.1  MCV 96.7 95.4  PLT 289 371   Cardiac Enzymes: No results found for this basename: CKTOTAL:3,CKMB:3,CKMBINDEX:3,TROPONINI:3 in the last 72 hours BNP: No results found for this basename: PROBNP:3 in the last 72 hours D-Dimer: No results found for this basename: DDIMER:2 in the last 72 hours CBG: No results found for  this basename: GLUCAP:6 in the last 72 hours Hemoglobin A1C: No results found for this basename: HGBA1C in the last 72 hours Fasting Lipid Panel: No results found for this basename: CHOL,HDL,LDLCALC,TRIG,CHOLHDL,LDLDIRECT in the last 72 hours Thyroid Function Tests: No results found for this basename: TSH,T4TOTAL,FREET4,T3FREE,THYROIDAB in the last 72 hours Anemia Panel: No results found for this basename: VITAMINB12,FOLATE,FERRITIN,TIBC,IRON,RETICCTPCT in the last 72 hours Coagulation:  Basename 03/21/12 0554  LABPROT 12.7  INR 0.96   Urine Drug Screen: Drugs of Abuse  No results found for this basename: labopia, cocainscrnur, labbenz, amphetmu, thcu, labbarb    Alcohol Level: No results found for this basename: ETH:2 in the last 72 hours Urinalysis:  Basename 03/20/12 1355  COLORURINE YELLOW  LABSPEC 1.015  PHURINE 6.0  GLUCOSEU NEGATIVE  HGBUR NEGATIVE  BILIRUBINUR NEGATIVE  KETONESUR NEGATIVE  PROTEINUR NEGATIVE  UROBILINOGEN 0.2  NITRITE NEGATIVE  LEUKOCYTESUR NEGATIVE   No results found for this or any previous visit (from the past 240 hour(s)).  Studies/Results: Ct Abdomen Pelvis W Contrast  03/20/2012  *RADIOLOGY REPORT*  Clinical Data: 41 year old female with abdominal and pelvic pain and rectal bleeding.  CT ABDOMEN AND PELVIS WITH CONTRAST  Technique:  Multidetector CT imaging of the abdomen and pelvis was performed following the standard protocol during bolus administration of intravenous contrast.  Contrast: OMNIPAQUE IOHEXOL 300 MG/ML  SOLN  Comparison: 12/31/2004 CT  Findings: The liver, spleen, pancreas, gallbladder, adrenal glands and kidneys are unremarkable.  There is diffuse circumferential wall thickening of the entire sigmoid colon with adjacent inflammation - compatible with colitis. No other bowel abnormalities are identified. The appendix is normal.  No free fluid, enlarged lymph nodes, biliary dilation or abdominal aortic aneurysm identified.  There is no evidence of abscess, pneumoperitoneum or bowel obstruction.  The bladder is within normal limits. The uterus and ovaries are unremarkable.  No acute or suspicious bony abnormalities are identified.  IMPRESSION: Colitis of the entire sigmoid colon - likely inflammatory or infectious.  No evidence of pneumoperitoneum, abscess or bowel obstruction.   Original Report Authenticated By: Harmon Pier, M.D.     Medications: Scheduled Meds:   . ALPRAZolam  1 mg Oral QID  . [COMPLETED] influenza  inactive virus vaccine  0.5 mL Intramuscular Tomorrow-1000  . lisinopril  10 mg Oral Daily  . [COMPLETED] morphine  4 mg Intravenous Once  . nortriptyline  100 mg Oral QHS  . nortriptyline  50 mg Oral Daily  . [COMPLETED] ondansetron  4 mg Intravenous Once  . [COMPLETED] pneumococcal 23 valent vaccine  0.5 mL Intramuscular Tomorrow-1000  . potassium chloride  40 mEq Oral Once  . [DISCONTINUED] ciprofloxacin  400 mg Intravenous Q12H  . [DISCONTINUED] ciprofloxacin  400 mg Intravenous Q12H  . [DISCONTINUED] metroNIDAZOLE  500 mg Intravenous Q12H  . [DISCONTINUED] metronidazole  500 mg Intravenous Q8H   Continuous Infusions:   . 0.9 % NaCl with KCl 20 mEq / L 75 mL/hr at 03/20/12 2021  . [DISCONTINUED] sodium chloride 1,000 mL (03/20/12 1418)  . [DISCONTINUED] sodium chloride     PRN Meds:.acetaminophen, acetaminophen, HYDROcodone-acetaminophen, [COMPLETED] iohexol, meclizine, morphine, ondansetron (ZOFRAN) IV, ondansetron, [DISCONTINUED] ondansetron (ZOFRAN) IV  Assessment/Plan:  Principal Problem:  *Colitis Active Problems:  Rectal bleeding  Hypokalemia  Hypertension  Anxiety  1. Colitis.  Ischemic vs. Infectious. Continue antibiotics and switch to po. Appreciate GI assistance.  Advance diet today.  Will plan on following up with GI as an outpatient to consider colonoscopy. Follow up stool studies  2. Hypokalemia, replace  3. Hypertension, stable  4. Dispo.  Likely discharge home  in am if tolerating solid diet.  Time spent coordinating care:   LOS: 1 day   Spero Gunnels [facility] Pager: [phone] 03/21/2012, 1:49 PM

## 2012-03-21 NOTE — Consult Note (Signed)
Referring Provider:  Kerry Hough Primary Care Physician:  Remus Loffler, PA Primary Gastroenterologist:  Dr.  Jena Gauss  Reason for Consultation:  Colitis   HPI:   Pleasant 41 year old lady admitted hospital yesterday with recent nonbloody diarrhea followed by gross blood per rectum. States she was in her usual state of good health until 5 days prior to admission when she started to feel a little bad with malaise weakness chills but no fever. Nonbloody diarrhea followed - lasted one to 2 days along with lower abdominal cramps. This was followed by gross blood per rectum. Minimally elevated white count 11,000 range; overall hemoglobin has remained good. Over the past 24 hours, bloody diarrhea has subsided along with the abdominal cramps. Patient wants to go home. She's been tolerating a clear liquid diet. Stool studies have been ordered. She is already been started on Cipro and Flagyl.  In addition to being a smoker, she has been using quite a bit of BC powders recently for various aches and pains. No family history of inflammatory bowel disease or colitis/CRC. No sick contacts. No recent antibiotics.  I have reviewed the CT scan with Dr. Kearney Hard. Inflammatory changes of the colon are well demarcated and limited to the sigmoid segment with a normal appearing more proximal colon and rectum.   Past Medical History  Diagnosis Date  . Cerebellar bleed   . Hypertension   . Depression   . Anxiety   . High cholesterol   . Vertigo     Past Surgical History  Procedure Date  . Brain surgery     Prior to Admission medications   Medication Sig Start Date End Date Taking? Authorizing Provider  ALPRAZolam Prudy Feeler) 1 MG tablet Take 1 mg by mouth 4 (four) times daily.   Yes Historical Provider, MD  ferrous sulfate 325 (65 FE) MG tablet Take 325 mg by mouth at bedtime.   Yes Historical Provider, MD  gemfibrozil (LOPID) 600 MG tablet Take 600 mg by mouth 2 (two) times daily.   Yes Historical Provider, MD    HYDROcodone-acetaminophen (VICODIN) 5-500 MG per tablet Take 1 tablet by mouth 3 (three) times daily as needed. pain   Yes Historical Provider, MD  lisinopril (PRINIVIL,ZESTRIL) 10 MG tablet Take 10 mg by mouth daily.   Yes Historical Provider, MD  meclizine (ANTIVERT) 25 MG tablet Take 25 mg by mouth 3 (three) times daily as needed. dizziness   Yes Historical Provider, MD  Multiple Vitamin (MULTIVITAMIN) tablet Take 1 tablet by mouth daily.   Yes Historical Provider, MD  nortriptyline (PAMELOR) 50 MG capsule Take 50-100 mg by mouth 2 (two) times daily. Patient takes 1 capsule in the morning and 2 capsules at night   Yes Historical Provider, MD    Current Facility-Administered Medications  Medication Dose Route Frequency Provider Last Rate Last Dose  . 0.9 %  sodium chloride infusion   Intravenous Continuous Carleene Cooper III, MD      . 0.9 % NaCl with KCl 20 mEq/ L  infusion   Intravenous Continuous Erick Blinks, MD 75 mL/hr at 03/20/12 2021    . acetaminophen (TYLENOL) tablet 650 mg  650 mg Oral Q6H PRN Erick Blinks, MD       Or  . acetaminophen (TYLENOL) suppository 650 mg  650 mg Rectal Q6H PRN Erick Blinks, MD      . ALPRAZolam Prudy Feeler) tablet 1 mg  1 mg Oral QID Erick Blinks, MD   1 mg at 03/21/12 1000  . ciprofloxacin (CIPRO) IVPB  400 mg  400 mg Intravenous Q12H Erick Blinks, MD   400 mg at 03/21/12 0800  . HYDROcodone-acetaminophen (NORCO/VICODIN) 5-325 MG per tablet 1 tablet  1 tablet Oral Q4H PRN Erick Blinks, MD   1 tablet at 03/21/12 1146  . [COMPLETED] influenza  inactive virus vaccine (FLUZONE/FLUARIX) injection 0.5 mL  0.5 mL Intramuscular Tomorrow-1000 Erick Blinks, MD   0.5 mL at 03/21/12 1000  . [COMPLETED] iohexol (OMNIPAQUE) 300 MG/ML solution 100 mL  100 mL Intravenous Once PRN Medication Radiologist, MD   100 mL at 03/20/12 1637  . lisinopril (PRINIVIL,ZESTRIL) tablet 10 mg  10 mg Oral Daily Erick Blinks, MD   10 mg at 03/21/12 1000  . meclizine (ANTIVERT)  tablet 25 mg  25 mg Oral TID PRN Erick Blinks, MD      . metroNIDAZOLE (FLAGYL) IVPB 500 mg  500 mg Intravenous Q8H Erick Blinks, MD   500 mg at 03/21/12 0524  . [COMPLETED] morphine 4 MG/ML injection 4 mg  4 mg Intravenous Once Carleene Cooper III, MD   4 mg at 03/20/12 1615  . morphine 4 MG/ML injection 4 mg  4 mg Intravenous Q4H PRN Carleene Cooper III, MD   4 mg at 03/20/12 2035  . nortriptyline (PAMELOR) capsule 100 mg  100 mg Oral QHS Erick Blinks, MD   100 mg at 03/20/12 2145  . nortriptyline (PAMELOR) capsule 50 mg  50 mg Oral Daily Erick Blinks, MD      . [COMPLETED] ondansetron (ZOFRAN) injection 4 mg  4 mg Intravenous Once Carleene Cooper III, MD   4 mg at 03/20/12 1615  . ondansetron (ZOFRAN) tablet 4 mg  4 mg Oral Q6H PRN Erick Blinks, MD       Or  . ondansetron (ZOFRAN) injection 4 mg  4 mg Intravenous Q6H PRN Erick Blinks, MD      . pneumococcal 23 valent vaccine (PNU-IMMUNE) injection 0.5 mL  0.5 mL Intramuscular Tomorrow-1000 Erick Blinks, MD      . [DISCONTINUED] 0.9 %  sodium chloride infusion  1,000 mL Intravenous Continuous Carleene Cooper III, MD 125 mL/hr at 03/20/12 1418 1,000 mL at 03/20/12 1418  . [DISCONTINUED] ciprofloxacin (CIPRO) IVPB 400 mg  400 mg Intravenous Q12H Carleene Cooper III, MD      . [DISCONTINUED] metroNIDAZOLE (FLAGYL) IVPB 500 mg  500 mg Intravenous Q12H Carleene Cooper III, MD      . [DISCONTINUED] ondansetron [facility]) injection 4 mg  4 mg Intravenous Q4H PRN Carleene Cooper III, MD        Allergies as of 03/20/2012 - Review Complete 03/20/2012  Allergen Reaction Noted  . Remeron (mirtazapine)  03/20/2012    History reviewed. No pertinent family history.  History   Social History  . Marital Status: Single    Spouse Name: N/A    Number of Children: N/A  . Years of Education: N/A   Occupational History  . Not on file.   Social History Main Topics  . Smoking status: Current Every Day Smoker -- 0.5 packs/day  . Smokeless tobacco: Never  Used  . Alcohol Use: No  . Drug Use: No  . Sexually Active:    Other Topics Concern  . Not on file   Social History Narrative  . No narrative on file    Review of Systems: Gen:  weight loss, and sleep disorder CV: Denies chest pain, angina, palpitations, syncope, orthopnea, PND, peripheral edema, and claudication. Resp: Denies dyspnea at rest, dyspnea with exercise, cough, sputum, wheezing, coughing  up blood, and pleurisy. GI: Denies vomiting blood, jaundice, and fecal incontinence.   Denies dysphagia or odynophagia. Derm: Denies rash, itching, dry skin, hives, moles, warts, or unhealing ulcers.  Psych: Denies depression, anxiety, memory loss, suicidal ideation, hallucinations, paranoia, and confusion. Heme: Denies bruising, bleeding, and enlarged lymph nodes.   Physical Exam: Vital signs in last 24 hours: Temp:  [97.7 F (36.5 C)-98.2 F (36.8 C)] 98.2 F (36.8 C) (11/29 0501) Pulse Rate:  [79-108] 79  (11/29 0501) Resp:  [18] 18  (11/29 0501) BP: (107-179)/(75-100) 107/75 mmHg (11/29 0501) SpO2:  [97 %-100 %] 98 % (11/29 0501) Weight:  [160 lb (72.576 kg)] 160 lb (72.576 kg) (11/28 1837) Last BM Date: 03/20/12 General:   Short stature. Alert,  Well-developed, well-nourished, pleasant and cooperative in NAD Head:  Normocephalic and atraumatic. Eyes:  Sclera clear, no icterus.   Conjunctiva pink. Ears:  Normal auditory acuity. Nose:  No deformity, discharge,  or lesions. Mouth:  No deformity or lesions, dentition normal. Neck:  Supple; no masses or thyromegaly. Lungs:  Clear throughout to auscultation.   No wheezes, crackles, or rhonchi. No acute distress. Heart:  Regular rate and rhythm; no murmurs, clicks, rubs,  or gallops. Abdomen:  Obese. Positive bowel sounds. Localized tenderness left lower quadrant without mass or hepatosplenomegaly. No rebound or guarding.  Msk:  Symmetrical without gross deformities. Normal posture. Pulses:  Normal pulses noted. Extremities:   Without clubbing or edema. Neurologic:  Alert and  oriented x4;  grossly normal neurologically. Skin:  Intact without significant lesions or rashes. Cervical Nodes:  No significant cervical adenopathy. Psych:  Alert and cooperative. Normal mood and affect.  Intake/Output from previous day: 11/28 0701 - 11/29 0700 In: -  Out: 400 [Urine:400] Intake/Output this shift:    Lab Results:  Basename 03/21/12 0554 03/20/12 1536  WBC 7.6 11.2*  HGB 12.1 13.9  HCT 35.7* 41.1  PLT 289 371   BMET  Basename 03/21/12 0554 03/20/12 1536  NA 137 136  K 3.4* 3.1*  CL 102 99  CO2 27 26  GLUCOSE 95 96  BUN 4* 10  CREATININE 0.53 0.80  CALCIUM 8.3* 9.3   LFT  Basename 03/21/12 0554  PROT 6.1  ALBUMIN 3.1*  AST 15  ALT 17  ALKPHOS 45  BILITOT 0.2*  BILIDIR --  IBILI --   PT/INR  Basename 03/21/12 0554  LABPROT 12.7  INR 0.96   Impression: Pleasant 41 year old lady with the acute onset malaise chills and nonbloody diarrhea followed by more or less grossly bloody stools this week which have tapered off significantly over the past 24 hours. She has a localized segmental colitis on CT. She is a smoker. She has been using quite a bit of aspirin recently.  Although she does have some atypical features, the scenario including CT findings is most consistent with segmental or ischemic colitis. I doubt this is new onset inflammatory bowel disease. Likewise, given the well demarcated nature of the inflammation on CT, unlikely to be infection although this possibility remains in the differential. Tobacco use puts her at risk for ischemia. Superimposed aspirin use could insight/or exacerbate colitis from any cause.  Clinically, she appears to be significantly improved already.  Recommendations: The patient has been already started on IV Cipro and Flagyl; At this point, would transition her to oral therapy and complete a ten-day course empirically. Follow up on stool studies.  Smoking cessation  recommended. Limit NSAID use, particularly in this setting.  Given significant morphological changes  in her colon seen on the CT this admission, will plan to see her back in the office in the coming weeks and likely offer her a colonoscopy to cover all bases at that time.  I'd like to thank the hospitalist service for allowing me to see this nice this lady today.

## 2012-03-22 LAB — CLOSTRIDIUM DIFFICILE BY PCR: Toxigenic C. Difficile by PCR: NEGATIVE

## 2012-03-22 MED ORDER — CIPROFLOXACIN HCL 500 MG PO TABS: 500.0000 mg | ORAL_TABLET | Freq: Two times a day (BID) | ORAL | Status: DC

## 2012-03-22 MED ORDER — EPINEPHRINE 0.3 MG/0.3ML IJ DEVI: 0.3000 mg | Freq: Once | INTRAMUSCULAR | Status: DC

## 2012-03-22 MED ORDER — METRONIDAZOLE 500 MG PO TABS: 500.0000 mg | ORAL_TABLET | Freq: Three times a day (TID) | ORAL | Status: DC

## 2012-03-22 MED ORDER — DIPHENHYDRAMINE HCL 50 MG PO CAPS: 50.0000 mg | ORAL_CAPSULE | Freq: Four times a day (QID) | ORAL | Status: DC | PRN

## 2012-03-22 NOTE — Progress Notes (Signed)
Subjective: Tolerating basically a regular diet. One semi-formed slightly old bloody stool this a.m. Overall, states she's feeling better wants to go home.  Objective: Vital signs in last 24 hours: Temp:  [98.2 F (36.8 C)-98.5 F (36.9 C)] 98.4 F (36.9 C) (11/30 0418) Pulse Rate:  [77-109] 109  (11/30 0418) Resp:  [18] 18  (11/30 0418) BP: (110-114)/(74-78) 114/78 mmHg (11/30 0418) SpO2:  [97 %-100 %] 98 % (11/30 0418) Last BM Date: 03/21/12 General:   Alert,  Well-developed, well-nourished, pleasant and cooperative in NAD Abdomen:  Obese. Positive bowel sounds; soft mild left lower quadrant tenderness to palpation. No mass or organomegaly.   Extremities:  Without clubbing or edema.    Intake/Output from previous day: 11/29 0701 - 11/30 0700 In: 2590.3 [P.O.:240; I.V.:2350.3] Out: 800 [Urine:800] Intake/Output this shift:    Lab Results:  Basename 03/21/12 0554 03/20/12 1536  WBC 7.6 11.2*  HGB 12.1 13.9  HCT 35.7* 41.1  PLT 289 371   BMET  Basename 03/21/12 0554 03/20/12 1536  NA 137 136  K 3.4* 3.1*  CL 102 99  CO2 27 26  GLUCOSE 95 96  BUN 4* 10  CREATININE 0.53 0.80  CALCIUM 8.3* 9.3   LFT  Basename 03/21/12 0554  PROT 6.1  ALBUMIN 3.1*  AST 15  ALT 17  ALKPHOS 45  BILITOT 0.2*  BILIDIR --  IBILI --   PT/INR  Basename 03/21/12 0554  LABPROT 12.7  INR 0.96   Studies/Results: Ct Abdomen Pelvis W Contrast  03/20/2012  *RADIOLOGY REPORT*  Clinical Data: 41 year old female with abdominal and pelvic pain and rectal bleeding.  CT ABDOMEN AND PELVIS WITH CONTRAST  Technique:  Multidetector CT imaging of the abdomen and pelvis was performed following the standard protocol during bolus administration of intravenous contrast.  Contrast: OMNIPAQUE IOHEXOL 300 MG/ML  SOLN  Comparison: 12/31/2004 CT  Findings: The liver, spleen, pancreas, gallbladder, adrenal glands and kidneys are unremarkable.  There is diffuse circumferential wall thickening of the  entire sigmoid colon with adjacent inflammation - compatible with colitis. No other bowel abnormalities are identified. The appendix is normal.  No free fluid, enlarged lymph nodes, biliary dilation or abdominal aortic aneurysm identified. There is no evidence of abscess, pneumoperitoneum or bowel obstruction.  The bladder is within normal limits. The uterus and ovaries are unremarkable.  No acute or suspicious bony abnormalities are identified.  IMPRESSION: Colitis of the entire sigmoid colon - likely inflammatory or infectious.  No evidence of pneumoperitoneum, abscess or bowel obstruction.   Original Report Authenticated By: Harmon Pier, M.D.    Impression: Acute segmental colitis-more likely ischemic and infectious in etiology. Doing much better already. Hemoglobin remains normal. Rapid turnaround more consistent with ischemia.  Recommendations: Home soon per hospitalist service. Complete course of antibiotics.  Office visit with [place] in about 4 weeks. Smoking cessation recommended.

## 2012-03-22 NOTE — Discharge Summary (Signed)
Physician Discharge Summary  Theresa Mullins BJY:[id number] DOB: [birth year] DOA: 03/20/2012  PCP: Remus Loffler, PA  Admit date: 03/20/2012 Discharge date: 03/22/2012  Time spent: 40 minutes  Recommendations for Outpatient Follow-up:  1. Follow up with GI in 4 weeks 2. Follow up with PCP in 2 weeks  Discharge Diagnoses:  Principal Problem:  *Colitis, likely ischemic Active Problems:  Rectal bleeding  Hypokalemia  Hypertension  Anxiety   Discharge Condition: improved  Diet recommendation: low salt  Filed Weights   03/20/12 1325 03/20/12 1837  Weight: 72.576 kg (160 lb) 72.576 kg (160 lb)    History of present illness:  history of prior intracranial bleeding status post craniotomy in the past. Patient was in her usual state of health when she developed onset of loose stools and associated blood per rectum. She describes it as bright red blood. She was having multiple bowel movements every day and was taking Imodium. Her symptoms progressively got worse. Today she noticed that her bleeding had progressed and she was noticing just a blood in her bowel movement. She has chronic vertigo but does feel slightly more lightheaded. She does not have any chest pain shortness of breath. She does have some lower abdominal pain which somewhat improves with bowel movement. She's not had any recent antibiotics. She does not have any fever. She does not have any sick contacts. She does take BC powders for pain. This is approximately twice a week.   Hospital Course:  This lady was admitted for abdominal pain and rectal bleeding.  CT indicated a sigmoid colitis. She was started empirically on cipro and flagyl.  Stool c diff was found to be negative.  She was seen by GI and it was felt that this was more likely an ischemic colitis.  Patient had rapid improvement in her symptoms.  Recommendations were to complete a course of antibiotics and follow up with GI service as an outpatient to consider  colonoscopy.   She did have outbreak of rash while in the hospital.  It is difficult to pinpoint the exact cause of rash.  She did receive a pneumonia vaccination this morning, and she has been on antibiotics for last 2 days.  She has been given benadryl for symptomatic relief and feels she is improving.  It is unlikely that this is being caused by antibiotics.  In any case, she has been advised to continue antibiotics, unless her rash continues to get worse, she has difficulty breathing, wheezing or feels her face/tongue swelling.  In that case she has been asked to stop antibiotics. She will also be given an epi pen incase she has a significant reaction.  Procedures:  none  Consultations:  GI, Dr. Jena Gauss  Discharge Exam: Filed Vitals:   03/21/12 0501 03/21/12 1510 03/21/12 2053 03/22/12 0418  BP: 107/75 110/74 112/75 114/78  Pulse: 79 77 94 109  Temp: 98.2 F (36.8 C) 98.5 F (36.9 C) 98.2 F (36.8 C) 98.4 F (36.9 C)  TempSrc: Oral Oral Oral Oral  Resp: 18 18 18 18   Height:      Weight:      SpO2: 98% 97% 100% 98%    General: NAD Cardiovascular: s1, s2 rrr Respiratory: cta b  Discharge Instructions  Discharge Orders    Future Orders Please Complete By Expires   Diet - low sodium heart healthy      Increase activity slowly      Call MD for:  temperature >100.4      Call MD  for:  difficulty breathing, headache or visual disturbances      Call MD for:  redness, tenderness, or signs of infection (pain, swelling, redness, odor or green/yellow discharge around incision site)      Call MD for:  severe uncontrolled pain          Medication List     As of 03/22/2012 11:34 AM    TAKE these medications         ALPRAZolam 1 MG tablet   Commonly known as: XANAX   Take 1 mg by mouth 4 (four) times daily.      ciprofloxacin 500 MG tablet   Commonly known as: CIPRO   Take 1 tablet (500 mg total) by mouth 2 (two) times daily.      diphenhydrAMINE 50 MG capsule   Commonly  known as: BENADRYL   Take 1 capsule (50 mg total) by mouth every 6 (six) hours as needed for itching.      EPINEPHrine 0.3 mg/0.3 mL Devi   Commonly known as: EPI-PEN   Inject 0.3 mLs (0.3 mg total) into the muscle once.      ferrous sulfate 325 (65 FE) MG tablet   Take 325 mg by mouth at bedtime.      gemfibrozil 600 MG tablet   Commonly known as: LOPID   Take 600 mg by mouth 2 (two) times daily.      HYDROcodone-acetaminophen 5-500 MG per tablet   Commonly known as: VICODIN   Take 1 tablet by mouth 3 (three) times daily as needed. pain      lisinopril 10 MG tablet   Commonly known as: PRINIVIL,ZESTRIL   Take 10 mg by mouth daily.      meclizine 25 MG tablet   Commonly known as: ANTIVERT   Take 25 mg by mouth 3 (three) times daily as needed. dizziness      metroNIDAZOLE 500 MG tablet   Commonly known as: FLAGYL   Take 1 tablet (500 mg total) by mouth every 8 (eight) hours.      multivitamin tablet   Take 1 tablet by mouth daily.      nortriptyline 50 MG capsule   Commonly known as: PAMELOR   Take 50-100 mg by mouth 2 (two) times daily. Patient takes 1 capsule in the morning and 2 capsules at night           Follow-up Information    Follow up with Eula Listen, MD. Schedule an appointment as soon as possible for a visit in 4 weeks.   Contact information:   [address] [place] [address] [phone]       Follow up with Remus Loffler, PA. Schedule an appointment as soon as possible for a visit in 2 weeks.   Contact information:   [address] [place] [address] [phone]           The results of significant diagnostics from this hospitalization (including imaging, microbiology, ancillary and laboratory) are listed below for reference.    Significant Diagnostic Studies: Ct Abdomen Pelvis W Contrast  03/20/2012  *RADIOLOGY REPORT*  Clinical Data: 41 year old female with abdominal and pelvic pain and rectal  bleeding.  CT ABDOMEN AND PELVIS WITH CONTRAST  Technique:  Multidetector CT imaging of the abdomen and pelvis was performed following the standard protocol during bolus administration of intravenous contrast.  Contrast: OMNIPAQUE IOHEXOL 300 MG/ML  SOLN  Comparison: 12/31/2004 CT  Findings: The liver,  spleen, pancreas, gallbladder, adrenal glands and kidneys are unremarkable.  There is diffuse circumferential wall thickening of the entire sigmoid colon with adjacent inflammation - compatible with colitis. No other bowel abnormalities are identified. The appendix is normal.  No free fluid, enlarged lymph nodes, biliary dilation or abdominal aortic aneurysm identified. There is no evidence of abscess, pneumoperitoneum or bowel obstruction.  The bladder is within normal limits. The uterus and ovaries are unremarkable.  No acute or suspicious bony abnormalities are identified.  IMPRESSION: Colitis of the entire sigmoid colon - likely inflammatory or infectious.  No evidence of pneumoperitoneum, abscess or bowel obstruction.   Original Report Authenticated By: Harmon Pier, M.D.     Microbiology: Recent Results (from the past 240 hour(s))  CLOSTRIDIUM DIFFICILE BY PCR     Status: Normal   Collection Time   03/22/12  8:35 AM      Component Value Range Status Comment   C difficile by pcr NEGATIVE  NEGATIVE Final      Labs: Basic Metabolic Panel:  Lab 03/21/12 5784 03/20/12 1536  NA 137 136  K 3.4* 3.1*  CL 102 99  CO2 27 26  GLUCOSE 95 96  BUN 4* 10  CREATININE 0.53 0.80  CALCIUM 8.3* 9.3  MG -- --  PHOS -- --   Liver Function Tests:  Lab 03/21/12 0554 03/20/12 1536  AST 15 21  ALT 17 24  ALKPHOS 45 56  BILITOT 0.2* 0.3  PROT 6.1 7.4  ALBUMIN 3.1* 3.7    Lab 03/20/12 1536  LIPASE 24  AMYLASE --   No results found for this basename: AMMONIA:5 in the last 168 hours CBC:  Lab 03/21/12 0554 03/20/12 1536  WBC 7.6 11.2*  NEUTROABS -- 9.1*  HGB 12.1 13.9  HCT 35.7* 41.1  MCV  96.7 95.4  PLT 289 371   Cardiac Enzymes: No results found for this basename: CKTOTAL:5,CKMB:5,CKMBINDEX:5,TROPONINI:5 in the last 168 hours BNP: BNP (last 3 results) No results found for this basename: PROBNP:3 in the last 8760 hours CBG: No results found for this basename: GLUCAP:5 in the last 168 hours     Signed:  Nyeshia Mysliwiec  [facility] 03/22/2012, 11:34 AM

## 2012-03-22 NOTE — Progress Notes (Signed)
Pt d/c'd home via personal vehicle driven by friend. Currently voices no c/o pain or discomfort. Discharge instructions and meds reviewed with pt with good understanding.

## 2012-03-24 ENCOUNTER — Telehealth: Payer: Self-pay | Admitting: *Deleted

## 2012-03-24 ENCOUNTER — Encounter: Payer: Self-pay | Admitting: *Deleted

## 2012-03-24 LAB — OCCULT BLOOD, POC DEVICE: Fecal Occult Bld: POSITIVE

## 2012-03-24 NOTE — Telephone Encounter (Signed)
error 

## 2012-03-26 LAB — STOOL CULTURE

## 2013-01-16 ENCOUNTER — Other Ambulatory Visit: Payer: Self-pay

## 2013-10-15 ENCOUNTER — Encounter (HOSPITAL_COMMUNITY): Payer: Self-pay | Admitting: Emergency Medicine

## 2013-10-15 ENCOUNTER — Emergency Department (HOSPITAL_COMMUNITY)
Admission: EM | Admit: 2013-10-15 | Discharge: 2013-10-15 | Disposition: A | Discharge status: Home / Self Care | Payer: BC Managed Care – PPO | Attending: Emergency Medicine | Admitting: Emergency Medicine

## 2013-10-15 DIAGNOSIS — R079 Chest pain, unspecified: Secondary | ICD-10-CM | POA: Insufficient documentation

## 2013-10-15 DIAGNOSIS — F411 Generalized anxiety disorder: Secondary | ICD-10-CM | POA: Insufficient documentation

## 2013-10-15 DIAGNOSIS — E7800 Pure hypercholesterolemia, unspecified: Secondary | ICD-10-CM | POA: Insufficient documentation

## 2013-10-15 DIAGNOSIS — F17200 Nicotine dependence, unspecified, uncomplicated: Secondary | ICD-10-CM | POA: Insufficient documentation

## 2013-10-15 DIAGNOSIS — F3289 Other specified depressive episodes: Secondary | ICD-10-CM | POA: Insufficient documentation

## 2013-10-15 DIAGNOSIS — F329 Major depressive disorder, single episode, unspecified: Secondary | ICD-10-CM | POA: Insufficient documentation

## 2013-10-15 DIAGNOSIS — Z791 Long term (current) use of non-steroidal anti-inflammatories (NSAID): Secondary | ICD-10-CM | POA: Insufficient documentation

## 2013-10-15 DIAGNOSIS — I10 Essential (primary) hypertension: Secondary | ICD-10-CM | POA: Insufficient documentation

## 2013-10-15 DIAGNOSIS — R42 Dizziness and giddiness: Secondary | ICD-10-CM

## 2013-10-15 DIAGNOSIS — R112 Nausea with vomiting, unspecified: Secondary | ICD-10-CM | POA: Insufficient documentation

## 2013-10-15 DIAGNOSIS — R259 Unspecified abnormal involuntary movements: Secondary | ICD-10-CM | POA: Insufficient documentation

## 2013-10-15 DIAGNOSIS — R252 Cramp and spasm: Secondary | ICD-10-CM

## 2013-10-15 DIAGNOSIS — Z79899 Other long term (current) drug therapy: Secondary | ICD-10-CM | POA: Insufficient documentation

## 2013-10-15 LAB — COMPREHENSIVE METABOLIC PANEL
ALBUMIN: 3.8 g/dL (ref 3.5–5.2)
ALK PHOS: 56 U/L (ref 39–117)
ALT: 15 U/L (ref 0–35)
AST: 17 U/L (ref 0–37)
BUN: 8 mg/dL (ref 6–23)
CALCIUM: 9.3 mg/dL (ref 8.4–10.5)
CO2: 27 mEq/L (ref 19–32)
Chloride: 101 mEq/L (ref 96–112)
Creatinine, Ser: 0.76 mg/dL (ref 0.50–1.10)
GFR calc Af Amer: >90 mL/min (ref >90)
GFR calc non Af Amer: >90 mL/min (ref >90)
Glucose, Bld: 101 mg/dL — ABNORMAL HIGH (ref 70–99)
POTASSIUM: 3.8 meq/L (ref 3.7–5.3)
SODIUM: 141 meq/L (ref 137–147)
TOTAL PROTEIN: 7.3 g/dL (ref 6.0–8.3)
Total Bilirubin: 0.2 mg/dL — ABNORMAL LOW (ref 0.3–1.2)

## 2013-10-15 LAB — CBC WITH DIFFERENTIAL/PLATELET
Basophils Absolute: 0 10*3/uL (ref 0.0–0.1)
Basophils Relative: 0 % (ref 0–1)
Eosinophils Absolute: 0.1 10*3/uL (ref 0.0–0.7)
Eosinophils Relative: 1 % (ref 0–5)
HEMATOCRIT: 37.5 % (ref 36.0–46.0)
Hemoglobin: 12.9 g/dL (ref 12.0–15.0)
LYMPHS ABS: 1.6 10*3/uL (ref 0.7–4.0)
LYMPHS PCT: 23 % (ref 12–46)
MCH: 33.1 pg (ref 26.0–34.0)
MCHC: 34.4 g/dL (ref 30.0–36.0)
MCV: 96.2 fL (ref 78.0–100.0)
MONO ABS: 0.4 10*3/uL (ref 0.1–1.0)
Monocytes Relative: 5 % (ref 3–12)
Neutro Abs: 5 10*3/uL (ref 1.7–7.7)
Neutrophils Relative %: 71 % (ref 43–77)
Platelets: 408 10*3/uL — ABNORMAL HIGH (ref 150–400)
RBC: 3.9 MIL/uL (ref 3.87–5.11)
RDW: 12.4 % (ref 11.5–15.5)
WBC: 7.1 10*3/uL (ref 4.0–10.5)

## 2013-10-15 LAB — MAGNESIUM: Magnesium: 2 mg/dL (ref 1.5–2.5)

## 2013-10-15 MED ORDER — ONDANSETRON 4 MG PO TBDP: 4.0000 mg | ORAL_TABLET | Freq: Three times a day (TID) | ORAL | Status: DC | PRN

## 2013-10-15 MED ORDER — MECLIZINE HCL 12.5 MG PO TABS
25.0000 mg | ORAL_TABLET | Freq: Once | ORAL | Status: AC
  Administered 2013-10-15: 25 mg via ORAL
  Filled 2013-10-15: qty 2

## 2013-10-15 MED ORDER — SODIUM CHLORIDE 0.9 % IV SOLN
INTRAVENOUS | Status: DC
  Administered 2013-10-15: 18:00:00 via INTRAVENOUS

## 2013-10-15 MED ORDER — SODIUM CHLORIDE 0.9 % IV BOLUS (SEPSIS)
500.0000 mL | Freq: Once | INTRAVENOUS | Status: AC
  Administered 2013-10-15: 500 mL via INTRAVENOUS

## 2013-10-15 MED ORDER — MECLIZINE HCL 25 MG PO TABS: 25.0000 mg | ORAL_TABLET | Freq: Four times a day (QID) | ORAL | Status: DC

## 2013-10-15 NOTE — ED Provider Notes (Signed)
CSN: [id number]     Arrival date & time 10/15/13  1611 History   First MD Initiated Contact with Patient 10/15/13 1624  This chart was scribed for Theresa Sorrow, MD by Anastasia Pall, ED Scribe. This patient was seen in room APA08/APA08 and the patient's care was started at 4:44 PM.    Chief Complaint  Patient presents with  . Spasms    (Consider location/radiation/quality/duration/timing/severity/associated sxs/prior Treatment) Patient is a 43 y.o. female presenting with dizziness. The history is provided by the patient. No language interpreter was used.  Dizziness Quality:  Room spinning Severity:  Mild Onset quality:  Gradual Duration:  1 week Timing:  Intermittent Chronicity:  Chronic (h/o vertigo) Relieved by: Antivert. Associated symptoms: chest pain (right), nausea and vomiting   Associated symptoms: no diarrhea, no headaches and no shortness of breath    HPI Comments: Theresa Mullins is a 43 y.o. female who presents to the Emergency Department complaining of spontaneous twitching around her mouth and LE onset 2 days ago, with associated right arm and right sided chest pain.   She reports h/o vertigo and has had a recurrence of vertigo over the past week, with associated vomiting and nausea. She reports her vomiting was at its worst yesterday. She denies nausea and vomiting today. She describes her vertigo as if the room is spinning. She reports associated blurry vision, but denies loss of vision and LOC. She reports being diagnosed with vertigo over a year ago after having an MRI which showed a possible cause for vertigo. She states she was put on Antivert, with some relief. She reports running out of her Antivert yesterday.   PCP - Terald Sleeper, PA-C - [facility]  Past Medical History  Diagnosis Date  . Cerebellar bleed   . Hypertension   . Depression   . Anxiety   . High cholesterol   . Vertigo    Past Surgical History  Procedure Laterality Date   . Brain surgery     History reviewed. No pertinent family history. History  Substance Use Topics  . Smoking status: Current Every Day Smoker -- 0.50 packs/day    Types: Cigarettes  . Smokeless tobacco: Never Used  . Alcohol Use: No   OB History   Grav Para Term Preterm Abortions TAB SAB Ect Mult Living                 Review of Systems  Constitutional: Negative for fever and chills.  HENT: Negative for ear pain, rhinorrhea and sore throat.   Eyes: Negative for visual disturbance.  Respiratory: Negative for cough, chest tightness and shortness of breath.   Cardiovascular: Positive for chest pain (right). Negative for leg swelling.  Gastrointestinal: Positive for nausea and vomiting. Negative for abdominal pain and diarrhea.  Genitourinary: Negative for dysuria and hematuria.  Musculoskeletal: Negative for back pain and neck pain.  Skin: Negative for rash.  Neurological: Positive for dizziness and tremors (spontaneous twitching around mouth and bilateral LE). Negative for syncope, weakness and headaches.  Hematological: Does not bruise/bleed easily.  Psychiatric/Behavioral: Negative for confusion.    Allergies  Ciprofloxacin and Remeron  Home Medications   Prior to Admission medications   Medication Sig Start Date End Date Taking? Authorizing Provider  ALPRAZolam Duanne Moron) 1 MG tablet Take 1 mg by mouth 4 (four) times daily.   Yes Historical Provider, MD  ferrous sulfate 325 (65 FE) MG tablet Take 325 mg by mouth at bedtime.   Yes Historical Provider, MD  furosemide (LASIX) 20 MG tablet Take 20 mg by mouth daily.   Yes Historical Provider, MD  gemfibrozil (LOPID) 600 MG tablet Take 600 mg by mouth 2 (two) times daily.   Yes Historical Provider, MD  HYDROcodone-acetaminophen (NORCO) 10-325 MG per tablet Take 1 tablet by mouth every 6 (six) hours as needed. pain   Yes Historical Provider, MD  lamoTRIgine (LAMICTAL) 200 MG tablet Take 200 mg by mouth 2 (two) times daily. For mood    Yes Historical Provider, MD  lisinopril (PRINIVIL,ZESTRIL) 10 MG tablet Take 10 mg by mouth daily.   Yes Historical Provider, MD  meclizine (ANTIVERT) 25 MG tablet Take 25 mg by mouth 3 (three) times daily as needed. dizziness   Yes Historical Provider, MD  meloxicam (MOBIC) 7.5 MG tablet Take 7.5 mg by mouth daily.   Yes Historical Provider, MD  Multiple Vitamin (MULTIVITAMIN) tablet Take 1 tablet by mouth daily.   Yes Historical Provider, MD  nortriptyline (PAMELOR) 25 MG capsule Take 25-50 mg by mouth 2 (two) times daily. Patient takes 2 capsules in the morning and 1 capsule at night   Yes Historical Provider, MD  sertraline (ZOLOFT) 50 MG tablet Take 50 mg by mouth daily.   Yes Historical Provider, MD  EPINEPHrine (EPI-PEN) 0.3 mg/0.3 mL DEVI Inject 0.3 mLs (0.3 mg total) into the muscle once. 03/22/12   Kathie Dike, MD  meclizine (ANTIVERT) 25 MG tablet Take 1 tablet (25 mg total) by mouth 4 (four) times daily. 10/15/13   Theresa Sorrow, MD  ondansetron (ZOFRAN ODT) 4 MG disintegrating tablet Take 1 tablet (4 mg total) by mouth every 8 (eight) hours as needed. 10/15/13   Theresa Sorrow, MD   BP 128/81  Pulse 118  Temp(Src) 98.4 F (36.9 C) (Oral)  Resp 18  Ht 4\' 7"  (1.397 m)  Wt 153 lb (69.4 kg)  BMI 35.56 kg/m2  SpO2 98% Physical Exam  Nursing note and vitals reviewed. Constitutional: She is oriented to person, place, and time. She appears well-developed and well-nourished. No distress.  HENT:  Head: Normocephalic and atraumatic.  Right Ear: Hearing, tympanic membrane, external ear and ear canal normal.  Left Ear: Hearing, tympanic membrane, external ear and ear canal normal.  Eyes: Conjunctivae and EOM are normal. Pupils are equal, round, and reactive to light.  Neck: Neck supple. No tracheal deviation present.  Cardiovascular: Normal rate, regular rhythm and normal heart sounds.   No murmur heard. Pulmonary/Chest: Effort normal and breath sounds normal. No respiratory  distress. She has no rales.  Abdominal: Soft. Bowel sounds are normal. There is no tenderness.  Musculoskeletal: Normal range of motion. She exhibits no edema.  Neurological: She is alert and oriented to person, place, and time. No cranial nerve deficit. She exhibits normal muscle tone. Coordination normal.  Skin: Skin is warm and dry.  Psychiatric: She has a normal mood and affect. Her behavior is normal.    ED Course  Procedures (including critical care time)  DIAGNOSTIC STUDIES: Oxygen Saturation is 98% on room air, normal by my interpretation.    COORDINATION OF CARE: 4:52 PM-Discussed treatment plan which includes Antivert and blood work with pt at bedside and pt agreed to plan.   Medications  0.9 %  sodium chloride infusion ( Intravenous New Bag/Given 10/15/13 1735)  sodium chloride 0.9 % bolus 500 mL (0 mLs Intravenous Stopped 10/15/13 1839)  meclizine (ANTIVERT) tablet 25 mg (25 mg Oral Given 10/15/13 1737)    Results for orders placed during the hospital  encounter of 10/15/13  COMPREHENSIVE METABOLIC PANEL      Result Value Ref Range   Sodium 141  137 - 147 mEq/L   Potassium 3.8  3.7 - 5.3 mEq/L   Chloride 101  96 - 112 mEq/L   CO2 27  19 - 32 mEq/L   Glucose, Bld 101 (*) 70 - 99 mg/dL   BUN 8  6 - 23 mg/dL   Creatinine, Ser 0.76  0.50 - 1.10 mg/dL   Calcium 9.3  8.4 - 10.5 mg/dL   Total Protein 7.3  6.0 - 8.3 g/dL   Albumin 3.8  3.5 - 5.2 g/dL   AST 17  0 - 37 U/L   ALT 15  0 - 35 U/L   Alkaline Phosphatase 56  39 - 117 U/L   Total Bilirubin 0.2 (*) 0.3 - 1.2 mg/dL   GFR calc non Af Amer >90  >90 mL/min   GFR calc Af Amer >90  >90 mL/min  CBC WITH DIFFERENTIAL      Result Value Ref Range   WBC 7.1  4.0 - 10.5 K/uL   RBC 3.90  3.87 - 5.11 MIL/uL   Hemoglobin 12.9  12.0 - 15.0 g/dL   HCT 37.5  36.0 - 46.0 %   MCV 96.2  78.0 - 100.0 fL   MCH 33.1  26.0 - 34.0 pg   MCHC 34.4  30.0 - 36.0 g/dL   RDW 12.4  11.5 - 15.5 %   Platelets 408 (*) 150 - 400 K/uL    Neutrophils Relative % 71  43 - 77 %   Neutro Abs 5.0  1.7 - 7.7 K/uL   Lymphocytes Relative 23  12 - 46 %   Lymphs Abs 1.6  0.7 - 4.0 K/uL   Monocytes Relative 5  3 - 12 %   Monocytes Absolute 0.4  0.1 - 1.0 K/uL   Eosinophils Relative 1  0 - 5 %   Eosinophils Absolute 0.1  0.0 - 0.7 K/uL   Basophils Relative 0  0 - 1 %   Basophils Absolute 0.0  0.0 - 0.1 K/uL  MAGNESIUM      Result Value Ref Range   Magnesium 2.0  1.5 - 2.5 mg/dL   No results found.    EKG Interpretation None      MDM   Final diagnoses:  Vertigo  Spasm    Patient with known history of recurrent vertigo. Patient ran out of her Antivert a few days ago. Patient was having spasms around her mouth and then all arms and legs. Clinically was concerning may be for hypo-calcium he however labs electrolytes are very normal. I will discontinue the Antivert and Zofran to help prevent the vomiting. Patient will followup with her record Dr.  I personally performed the services described in this documentation, which was scribed in my presence. The recorded information has been reviewed and is accurate.     Theresa Sorrow, MD 10/15/13 1911

## 2013-10-15 NOTE — Discharge Instructions (Signed)
Benign Positional Vertigo Vertigo means you feel like you or your surroundings are moving when they are not. Benign positional vertigo is the most common form of vertigo. Benign means that the cause of your condition is not serious. Benign positional vertigo is more common in older adults. CAUSES  Benign positional vertigo is the result of an upset in the labyrinth system. This is an area in the middle ear that helps control your balance. This may be caused by a viral infection, head injury, or repetitive motion. However, often no specific cause is found. SYMPTOMS  Symptoms of benign positional vertigo occur when you move your head or eyes in different directions. Some of the symptoms may include:  Loss of balance and falls.  Vomiting.  Blurred vision.  Dizziness.  Nausea.  Involuntary eye movements (nystagmus). DIAGNOSIS  Benign positional vertigo is usually diagnosed by physical exam. If the specific cause of your benign positional vertigo is unknown, your caregiver may perform imaging tests, such as magnetic resonance imaging (MRI) or computed tomography (CT). TREATMENT  Your caregiver may recommend movements or procedures to correct the benign positional vertigo. Medicines such as meclizine, benzodiazepines, and medicines for nausea may be used to treat your symptoms. In rare cases, if your symptoms are caused by certain conditions that affect the inner ear, you may need surgery. HOME CARE INSTRUCTIONS   Follow your caregiver's instructions.  Move slowly. Do not make sudden body or head movements.  Avoid driving.  Avoid operating heavy machinery.  Avoid performing any tasks that would be dangerous to you or others during a vertigo episode.  Drink enough fluids to keep your urine clear or pale yellow. SEEK IMMEDIATE MEDICAL CARE IF:   You develop problems with walking, weakness, numbness, or using your arms, hands, or legs.  You have difficulty speaking.  You develop  severe headaches.  Your nausea or vomiting continues or gets worse.  You develop visual changes.  Your family or friends notice any behavioral changes.  Your condition gets worse.  You have a fever.  You develop a stiff neck or sensitivity to light. MAKE SURE YOU:   Understand these instructions.  Will watch your condition.  Will get help right away if you are not doing well or get worse. Document Released: 01/15/2006 Document Revised: 07/02/2011 Document Reviewed: 12/28/2010 [facility] Patient Information 2015 [place], [place]. This information is not intended to replace advice given to you by your health care provider. Make sure you discuss any questions you have with your health care provider.  Resume taking your Antivert. Take the Zofran as needed for nausea and vomiting. Make an appointment to followup with your regular Dr. in the next few days. Return for any new or worse symptoms. Your electrolytes here today were normal.

## 2013-10-15 NOTE — ED Notes (Signed)
Per pt with spasms to side of mouth for 3 days, today has right arm pain, pt with spasms at different parts of body at different times

## 2013-11-23 ENCOUNTER — Emergency Department (HOSPITAL_COMMUNITY)
Admission: EM | Admit: 2013-11-23 | Discharge: 2013-11-23 | Disposition: A | Discharge status: Home / Self Care | Payer: BC Managed Care – PPO | Attending: Emergency Medicine | Admitting: Emergency Medicine

## 2013-11-23 ENCOUNTER — Encounter (HOSPITAL_COMMUNITY): Payer: Self-pay | Admitting: Emergency Medicine

## 2013-11-23 ENCOUNTER — Emergency Department (HOSPITAL_COMMUNITY): Payer: BC Managed Care – PPO

## 2013-11-23 DIAGNOSIS — I10 Essential (primary) hypertension: Secondary | ICD-10-CM | POA: Insufficient documentation

## 2013-11-23 DIAGNOSIS — R42 Dizziness and giddiness: Secondary | ICD-10-CM

## 2013-11-23 DIAGNOSIS — Z79899 Other long term (current) drug therapy: Secondary | ICD-10-CM | POA: Insufficient documentation

## 2013-11-23 DIAGNOSIS — F411 Generalized anxiety disorder: Secondary | ICD-10-CM | POA: Insufficient documentation

## 2013-11-23 DIAGNOSIS — Z862 Personal history of diseases of the blood and blood-forming organs and certain disorders involving the immune mechanism: Secondary | ICD-10-CM | POA: Insufficient documentation

## 2013-11-23 DIAGNOSIS — F329 Major depressive disorder, single episode, unspecified: Secondary | ICD-10-CM | POA: Insufficient documentation

## 2013-11-23 DIAGNOSIS — F3289 Other specified depressive episodes: Secondary | ICD-10-CM | POA: Insufficient documentation

## 2013-11-23 DIAGNOSIS — Z791 Long term (current) use of non-steroidal anti-inflammatories (NSAID): Secondary | ICD-10-CM | POA: Insufficient documentation

## 2013-11-23 DIAGNOSIS — R110 Nausea: Secondary | ICD-10-CM | POA: Insufficient documentation

## 2013-11-23 DIAGNOSIS — R51 Headache: Secondary | ICD-10-CM | POA: Insufficient documentation

## 2013-11-23 DIAGNOSIS — Z8639 Personal history of other endocrine, nutritional and metabolic disease: Secondary | ICD-10-CM | POA: Insufficient documentation

## 2013-11-23 DIAGNOSIS — F17200 Nicotine dependence, unspecified, uncomplicated: Secondary | ICD-10-CM | POA: Insufficient documentation

## 2013-11-23 MED ORDER — DIAZEPAM 5 MG PO TABS: 5.0000 mg | ORAL_TABLET | Freq: Three times a day (TID) | ORAL | Status: DC | PRN

## 2013-11-23 MED ORDER — MECLIZINE HCL 12.5 MG PO TABS
25.0000 mg | ORAL_TABLET | Freq: Once | ORAL | Status: AC
  Administered 2013-11-23: 25 mg via ORAL
  Filled 2013-11-23: qty 2

## 2013-11-23 MED ORDER — DIAZEPAM 5 MG/ML IJ SOLN
5.0000 mg | Freq: Once | INTRAMUSCULAR | Status: AC
  Administered 2013-11-23: 5 mg via INTRAVENOUS
  Filled 2013-11-23: qty 2

## 2013-11-23 NOTE — Discharge Instructions (Signed)
Dizziness Your symptoms are likely related to your known history of cerebellar bleed and hx of persistent vertigo.  Dizziness is a common problem. It is a feeling of unsteadiness or light-headedness. You may feel like you are about to faint. Dizziness can lead to injury if you stumble or fall. A person of any age group can suffer from dizziness, but dizziness is more common in older adults. CAUSES  Dizziness can be caused by many different things, including:  Middle ear problems.  Standing for too long.  Infections.  An allergic reaction.  Aging.  An emotional response to something, such as the sight of blood.  Side effects of medicines.  Tiredness.  Problems with circulation or blood pressure.  Excessive use of alcohol or medicines, or illegal drug use.  Breathing too fast (hyperventilation).  An irregular heart rhythm (arrhythmia).  A low red blood cell count (anemia).  Pregnancy.  Vomiting, diarrhea, fever, or other illnesses that cause body fluid loss (dehydration).  Diseases or conditions such as Parkinson's disease, high blood pressure (hypertension), diabetes, and thyroid problems.  Exposure to extreme heat. DIAGNOSIS  Your health care provider will ask about your symptoms, perform a physical exam, and perform an electrocardiogram (ECG) to record the electrical activity of your heart. Your health care provider may also perform other heart or blood tests to determine the cause of your dizziness. These may include:  Transthoracic echocardiogram (TTE). During echocardiography, sound waves are used to evaluate how blood flows through your heart.  Transesophageal echocardiogram (TEE).  Cardiac monitoring. This allows your health care provider to monitor your heart rate and rhythm in real time.  Holter monitor. This is a portable device that records your heartbeat and can help diagnose heart arrhythmias. It allows your health care provider to track your heart  activity for several days if needed.  Stress tests by exercise or by giving medicine that makes the heart beat faster. TREATMENT  Treatment of dizziness depends on the cause of your symptoms and can vary greatly. HOME CARE INSTRUCTIONS   Drink enough fluids to keep your urine clear or pale yellow. This is especially important in very hot weather. In older adults, it is also important in cold weather.  Take your medicine exactly as directed if your dizziness is caused by medicines. When taking blood pressure medicines, it is especially important to get up slowly.  Rise slowly from chairs and steady yourself until you feel okay.  In the morning, first sit up on the side of the bed. When you feel okay, stand slowly while holding onto something until you know your balance is fine.  Move your legs often if you need to stand in one place for a long time. Tighten and relax your muscles in your legs while standing.  Have someone stay with you for 1-2 days if dizziness continues to be a problem. Do this until you feel you are well enough to stay alone. Have the person call your health care provider if he or she notices changes in you that are concerning.  Do not drive or use heavy machinery if you feel dizzy.  Do not drink alcohol. SEEK IMMEDIATE MEDICAL CARE IF:   Your dizziness or light-headedness gets worse.  You feel nauseous or vomit.  You have problems talking, walking, or using your arms, hands, or legs.  You feel weak.  You are not thinking clearly or you have trouble forming sentences. It may take a friend or family member to notice this.  You have chest pain, abdominal pain, shortness of breath, or sweating.  Your vision changes.  You notice any bleeding.  You have side effects from medicine that seems to be getting worse rather than better. MAKE SURE YOU:   Understand these instructions.  Will watch your condition.  Will get help right away if you are not doing well  or get worse. Document Released: 10/03/2000 Document Revised: 04/14/2013 Document Reviewed: 10/27/2010 [facility] Patient Information 2015 [place], [place]. This information is not intended to replace advice given to you by your health care provider. Make sure you discuss any questions you have with your health care provider.

## 2013-11-23 NOTE — ED Provider Notes (Signed)
CSN: [id number]     Arrival date & time 11/23/13  1020 History   First MD Initiated Contact with Patient 11/23/13 1036    This chart was scribed for Merryl Hacker, MD by Terressa Koyanagi, ED Scribe. This patient was seen in room APA10/APA10 and the patient's care was started at 10:38 AM.  Chief Complaint  Patient presents with  . Dizziness  PCP: Terald Sleeper, PA-C  The history is provided by the patient.   HPI Comments: Theresa Mullins is a 43 y.o. female who presents to the Emergency Department complaining of intermittent dizziness onset two months ago. Pt describes her dizziness as "the floor moving," noting that movement of her eyes makes her Sx worse. Pt reports nothing improves her Sx and antivert only makes the symptoms go away for "a little while." Patient reports history of the same on and off following a cerebellar bleed.  Pt denies any recent ear infections; however, complains of associated intermittent, HA to the back of her head. Pt describes her HA as a "thumping" sensation and states the pain is relatively mild at present.     Past Medical History  Diagnosis Date  . Cerebellar bleed   . Hypertension   . Depression   . Anxiety   . High cholesterol   . Vertigo    Past Surgical History  Procedure Laterality Date  . Brain surgery     History reviewed. No pertinent family history. History  Substance Use Topics  . Smoking status: Current Every Day Smoker -- 0.50 packs/day    Types: Cigarettes  . Smokeless tobacco: Never Used  . Alcohol Use: No   OB History   Grav Para Term Preterm Abortions TAB SAB Ect Mult Living                 Review of Systems  Constitutional: Negative for fever.  Respiratory: Negative for chest tightness and shortness of breath.   Cardiovascular: Negative for chest pain.  Gastrointestinal: Positive for nausea. Negative for vomiting.  Genitourinary: Negative for dysuria.  Neurological: Positive for dizziness and headaches. Negative for  weakness, light-headedness and numbness.  All other systems reviewed and are negative.     Allergies  Ciprofloxacin and Remeron  Home Medications   Prior to Admission medications   Medication Sig Start Date End Date Taking? Authorizing Provider  acetaminophen (TYLENOL) 500 MG tablet Take 1,500 mg by mouth daily as needed for headache.   Yes Historical Provider, MD  ALPRAZolam Duanne Moron) 1 MG tablet Take 1 mg by mouth 4 (four) times daily.   Yes Historical Provider, MD  ferrous sulfate 325 (65 FE) MG tablet Take 325 mg by mouth at bedtime.   Yes Historical Provider, MD  furosemide (LASIX) 20 MG tablet Take 20 mg by mouth daily.   Yes Historical Provider, MD  gemfibrozil (LOPID) 600 MG tablet Take 600 mg by mouth at bedtime.    Yes Historical Provider, MD  HYDROcodone-acetaminophen (NORCO) 10-325 MG per tablet Take 1 tablet by mouth 3 (three) times daily as needed for moderate pain. pain   Yes Historical Provider, MD  lamoTRIgine (LAMICTAL) 200 MG tablet Take 200 mg by mouth 2 (two) times daily. For mood   Yes Historical Provider, MD  lisinopril (PRINIVIL,ZESTRIL) 10 MG tablet Take 10 mg by mouth daily.   Yes Historical Provider, MD  meclizine (ANTIVERT) 25 MG tablet Take 1 tablet (25 mg total) by mouth 4 (four) times daily. 10/15/13  Yes Fredia Sorrow, MD  meloxicam (  MOBIC) 7.5 MG tablet Take 7.5 mg by mouth at bedtime.    Yes Historical Provider, MD  Multiple Vitamin (MULTIVITAMIN) tablet Take 1 tablet by mouth daily.   Yes Historical Provider, MD  nortriptyline (PAMELOR) 25 MG capsule Take 25-50 mg by mouth 2 (two) times daily. Patient takes 2 capsules in the morning and 1 capsule at night   Yes Historical Provider, MD  sertraline (ZOLOFT) 50 MG tablet Take 50 mg by mouth daily.   Yes Historical Provider, MD  diazepam (VALIUM) 5 MG tablet Take 1 tablet (5 mg total) by mouth every 8 (eight) hours as needed (vertigo). 11/23/13   Merryl Hacker, MD   Triage Vitals: BP 130/85  Pulse 95   Temp(Src) 98 F (36.7 C) (Oral)  Ht 4\' 7"  (1.397 m)  Wt 145 lb (65.772 kg)  BMI 33.70 kg/m2  SpO2 100%  LMP 11/02/2013 Physical Exam  Nursing note and vitals reviewed. Constitutional: She is oriented to person, place, and time. She appears well-developed and well-nourished.  HENT:  Head: Normocephalic and atraumatic.  Mouth/Throat: Oropharynx is clear and moist.  Eyes: EOM are normal. Pupils are equal, round, and reactive to light.  Horizontal nystagmus noted  Cardiovascular: Normal rate, regular rhythm and normal heart sounds.   No murmur heard. Pulmonary/Chest: Effort normal and breath sounds normal. No respiratory distress. She has no wheezes.  Abdominal: Soft. There is no tenderness.  Musculoskeletal: She exhibits no edema.  Neurological: She is alert and oriented to person, place, and time.  Cranial nerves II through XII intact, no dysmetria noted to finger-nose-finger, normal gait  Skin: Skin is warm and dry.  Psychiatric: She has a normal mood and affect.    ED Course  Procedures (including critical care time) DIAGNOSTIC STUDIES: Oxygen Saturation is 100% on RA, nl by my interpretation.    COORDINATION OF CARE: 10:41 AM-Discussed treatment plan which includes meds and imaging with pt at bedside and pt agreed to plan.   Labs Review Labs Reviewed - No data to display  Imaging Review Ct Head Wo Contrast  11/23/2013   CLINICAL DATA:  43 year old female with intermittent dizziness onset 2 months ago. Some difficulty walking. Headache back of head. History of prior intracranial hemorrhage 2006.  EXAM: CT HEAD WITHOUT CONTRAST  TECHNIQUE: Contiguous axial images were obtained from the base of the skull through the vertex without intravenous contrast.  COMPARISON:  Several prior exams most recent 11/01/2006.  FINDINGS: Prior right frontal craniotomy. 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  No intracranial acute hemorrhage.  The basilar artery  appears slightly dense, minimally changed from prior exams, probably normal for this patient. If there were any suspicion of infarct involving branches of the basilar tip then MR imaging can be obtained for further delineation. Currently, no CT evidence of large acute infarct otherwise noted.  No intracranial mass lesion noted on this unenhanced exam.  No hydrocephalus.  Mastoid air cells, middle ear cavities and visualized paranasal sinuses are clear.  Orbital structures unremarkable.  IMPRESSION: Postoperative changes as noted above without evidence of acute intracranial hemorrhage or definitive findings of large acute infarct. Please see above discussion.   Electronically Signed   By: Chauncey Cruel M.D.   On: 11/23/2013 11:58     EKG Interpretation None      MDM   Final diagnoses:  Vertigo    Patient presents with vertigo symptoms.  She has a history of similar symptoms that come and go since  having a cerebellar bleed. Her symptoms are ongoing for one month. No other neurologic deficit. No evidence of cerebellar dysfunction on exam. CT head was obtained to evaluate for recurrent bleed. Patient was given Antivert and Valium. CT scan is reassuring. Patient reports marked improvement of symptoms with treatment. Given reassuring neurologic exam and history of similar symptoms, do not feel the patient needs further imaging. Have low suspicion for acute ischemic stroke at this time. We'll discharge him with valium in addition to [place].  After history, exam, and medical workup I feel the patient has been appropriately medically screened and is safe for discharge home. Pertinent diagnoses were discussed with the patient. Patient was given return precautions.   I personally performed the services described in this documentation, which was scribed in my presence. The recorded information has been reviewed and is accurate.    Merryl Hacker, MD 11/23/13 1345

## 2013-11-23 NOTE — ED Notes (Signed)
Pt state she has a history of vertigo.  Pt's "vertigo started acting up" and was out of work for a week, but symptoms have been present since (1 month).

## 2013-11-23 NOTE — ED Notes (Signed)
Having dizziness for one months.  Pt says, "head feels funny, it's not a hurt"

## 2014-01-20 ENCOUNTER — Other Ambulatory Visit: Payer: Self-pay | Admitting: Neurology

## 2014-01-20 DIAGNOSIS — R519 Headache, unspecified: Secondary | ICD-10-CM

## 2014-01-20 DIAGNOSIS — R51 Headache: Principal | ICD-10-CM

## 2014-02-05 ENCOUNTER — Ambulatory Visit (HOSPITAL_COMMUNITY)
Admission: RE | Admit: 2014-02-05 | Discharge: 2014-02-05 | Disposition: A | Discharge status: Home / Self Care | Payer: BC Managed Care – PPO | Source: Ambulatory Visit | Attending: Neurology | Admitting: Neurology

## 2014-02-05 DIAGNOSIS — R42 Dizziness and giddiness: Secondary | ICD-10-CM | POA: Diagnosis not present

## 2014-02-05 DIAGNOSIS — R51 Headache: Secondary | ICD-10-CM | POA: Insufficient documentation

## 2014-02-05 DIAGNOSIS — G9389 Other specified disorders of brain: Secondary | ICD-10-CM | POA: Insufficient documentation

## 2014-02-05 DIAGNOSIS — H538 Other visual disturbances: Secondary | ICD-10-CM | POA: Diagnosis not present

## 2014-02-05 DIAGNOSIS — R519 Headache, unspecified: Secondary | ICD-10-CM

## 2014-03-09 ENCOUNTER — Other Ambulatory Visit (HOSPITAL_COMMUNITY): Payer: Self-pay | Admitting: Respiratory Therapy

## 2014-03-09 DIAGNOSIS — G4730 Sleep apnea, unspecified: Secondary | ICD-10-CM

## 2014-07-12 ENCOUNTER — Other Ambulatory Visit: Payer: Self-pay | Admitting: Orthopedic Surgery

## 2014-10-28 ENCOUNTER — Ambulatory Visit (HOSPITAL_BASED_OUTPATIENT_CLINIC_OR_DEPARTMENT_OTHER): Admission: RE | Admit: 2014-10-28 | Payer: Self-pay | Source: Ambulatory Visit | Admitting: Orthopedic Surgery

## 2014-10-28 ENCOUNTER — Encounter (HOSPITAL_BASED_OUTPATIENT_CLINIC_OR_DEPARTMENT_OTHER): Admission: RE | Payer: Self-pay | Source: Ambulatory Visit

## 2014-10-28 SURGERY — CARPAL TUNNEL RELEASE
Anesthesia: General | Laterality: Right

## 2015-10-27 ENCOUNTER — Emergency Department (HOSPITAL_COMMUNITY): Payer: BLUE CROSS/BLUE SHIELD

## 2015-10-27 ENCOUNTER — Encounter (HOSPITAL_COMMUNITY): Payer: Self-pay | Admitting: Emergency Medicine

## 2015-10-27 ENCOUNTER — Emergency Department (HOSPITAL_COMMUNITY)
Admission: EM | Admit: 2015-10-27 | Discharge: 2015-10-28 | Disposition: A | Discharge status: Home / Self Care | Payer: BLUE CROSS/BLUE SHIELD | Attending: Emergency Medicine | Admitting: Emergency Medicine

## 2015-10-27 DIAGNOSIS — Z79899 Other long term (current) drug therapy: Secondary | ICD-10-CM | POA: Diagnosis not present

## 2015-10-27 DIAGNOSIS — F329 Major depressive disorder, single episode, unspecified: Secondary | ICD-10-CM | POA: Insufficient documentation

## 2015-10-27 DIAGNOSIS — R51 Headache: Secondary | ICD-10-CM | POA: Insufficient documentation

## 2015-10-27 DIAGNOSIS — I10 Essential (primary) hypertension: Secondary | ICD-10-CM | POA: Diagnosis not present

## 2015-10-27 DIAGNOSIS — F17210 Nicotine dependence, cigarettes, uncomplicated: Secondary | ICD-10-CM | POA: Diagnosis not present

## 2015-10-27 DIAGNOSIS — R519 Headache, unspecified: Secondary | ICD-10-CM

## 2015-10-27 LAB — BASIC METABOLIC PANEL
Anion gap: 12 (ref 5–15)
BUN: 21 mg/dL — ABNORMAL HIGH (ref 6–20)
CALCIUM: 8.8 mg/dL — AB (ref 8.9–10.3)
CHLORIDE: 95 mmol/L — AB (ref 101–111)
CO2: 29 mmol/L (ref 22–32)
CREATININE: 0.95 mg/dL (ref 0.44–1.00)
Glucose, Bld: 95 mg/dL (ref 65–99)
Potassium: 3.7 mmol/L (ref 3.5–5.1)
SODIUM: 136 mmol/L (ref 135–145)

## 2015-10-27 LAB — CBC WITH DIFFERENTIAL/PLATELET
Basophils Absolute: 0 10*3/uL (ref 0.0–0.1)
Basophils Relative: 0 %
EOS ABS: 0.1 10*3/uL (ref 0.0–0.7)
Eosinophils Relative: 1 %
HCT: 38.2 % (ref 36.0–46.0)
HEMOGLOBIN: 12.8 g/dL (ref 12.0–15.0)
Lymphocytes Relative: 27 %
Lymphs Abs: 2.4 10*3/uL (ref 0.7–4.0)
MCH: 33.2 pg (ref 26.0–34.0)
MCHC: 33.5 g/dL (ref 30.0–36.0)
MCV: 99 fL (ref 78.0–100.0)
MONOS PCT: 4 %
Monocytes Absolute: 0.4 10*3/uL (ref 0.1–1.0)
NEUTROS PCT: 68 %
Neutro Abs: 6.1 10*3/uL (ref 1.7–7.7)
Platelets: 545 10*3/uL — ABNORMAL HIGH (ref 150–400)
RBC: 3.86 MIL/uL — ABNORMAL LOW (ref 3.87–5.11)
RDW: 12.3 % (ref 11.5–15.5)
WBC: 9 10*3/uL (ref 4.0–10.5)

## 2015-10-27 LAB — SEDIMENTATION RATE: SED RATE: 26 mm/h — AB (ref 0–22)

## 2015-10-27 MED ORDER — KETOROLAC TROMETHAMINE 30 MG/ML IJ SOLN
30.0000 mg | Freq: Once | INTRAMUSCULAR | Status: AC
  Administered 2015-10-27: 30 mg via INTRAVENOUS
  Filled 2015-10-27: qty 1

## 2015-10-27 MED ORDER — METOCLOPRAMIDE HCL 5 MG/ML IJ SOLN
10.0000 mg | Freq: Once | INTRAMUSCULAR | Status: AC
  Administered 2015-10-27: 10 mg via INTRAVENOUS
  Filled 2015-10-27: qty 2

## 2015-10-27 MED ORDER — DIPHENHYDRAMINE HCL 50 MG/ML IJ SOLN
25.0000 mg | Freq: Once | INTRAMUSCULAR | Status: AC
  Administered 2015-10-27: 25 mg via INTRAVENOUS
  Filled 2015-10-27: qty 1

## 2015-10-27 MED ORDER — IOPAMIDOL (ISOVUE-370) INJECTION 76%
100.0000 mL | Freq: Once | INTRAVENOUS | Status: AC | PRN
  Administered 2015-10-27: 100 mL via INTRAVENOUS

## 2015-10-27 NOTE — ED Notes (Signed)
Pt c/o headache x one week. Pt has not to the back of head.

## 2015-10-27 NOTE — ED Provider Notes (Signed)
CSN: [id number]     Arrival date & time 10/27/15  1941 History  By signing my name below, I, Dora Sims, attest that this documentation has been prepared under the direction and in the presence of physician practitioner, Ezequiel Essex, MD. Electronically Signed: Dora Sims, Scribe. 10/27/2015. 9:13 PM.   Chief Complaint  Patient presents with  . Headache    The history is provided by the patient. No language interpreter was used.     HPI Comments: Theresa Mullins is a 45 y.o. female with PMHx of cerebellar bleed, vertigo, and HTN who presents to the Emergency Department complaining of gradual onset, constant, waxing and waning, generalized headache onset one week. Pt also notes a "knot" on the right side of her head and states it feels like her head is swelling in that area. She also notes that she has felt off balance and dizzy lately. Pt reports that she went to her PCP several days ago for her symptoms and has an appointment with a neurologist in 5 days; she was told to come to the ER if her symptoms worsened. Pt states that she hits her head "all the time"; she hit the back of her head on a trash can yesterday. Pt reports that she never loses consciousness when she hits her head. She notes no alleviating factors for her headache. She endorses headache exacerbation with exposure to light and noise. 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 She does not have a VP shunt. She does not use blood thinners. She notes medication allergies to Ciprofloxacin and Remeron. Pt denies blurry vision, current dizziness, neck pain, recent falls/injuries, vomiting, or any other associated symptoms. Pt does not have a regular neurologist.  PCP: Dr. Particia Nearing  Past Medical History  Diagnosis Date  . Cerebellar bleed ([place])   . Hypertension   . Depression   . Anxiety   . High cholesterol   .  Vertigo    Past Surgical History  Procedure Laterality Date  . Brain surgery     History reviewed. No pertinent family history. Social History  Substance Use Topics  . Smoking status: Current Every Day Smoker -- 0.50 packs/day    Types: Cigarettes  . Smokeless tobacco: Never Used  . Alcohol Use: No   OB History    No data available     Review of Systems  A complete 10 system review of systems was obtained and all systems are negative except as noted in the HPI and PMH.   Allergies  Ciprofloxacin and Remeron  Home Medications   Prior to Admission medications   Medication Sig Start Date End Date Taking? Authorizing Provider  acetaminophen (TYLENOL) 500 MG tablet Take 1,500 mg by mouth daily as needed for headache.    Historical Provider, MD  ALPRAZolam Duanne Moron) 1 MG tablet Take 1 mg by mouth 4 (four) times daily.    Historical Provider, MD  diazepam (VALIUM) 5 MG tablet Take 1 tablet (5 mg total) by mouth every 8 (eight) hours as needed (vertigo). 11/23/13   Merryl Hacker, MD  ferrous sulfate 325 (65 FE) MG tablet Take 325 mg by mouth at bedtime.    Historical Provider, MD  furosemide (LASIX) 20 MG tablet Take 20 mg by mouth daily.    Historical Provider, MD  gemfibrozil (LOPID) 600 MG tablet Take 600 mg by mouth at bedtime.  Historical Provider, MD  HYDROcodone-acetaminophen (NORCO) 10-325 MG per tablet Take 1 tablet by mouth 3 (three) times daily as needed for moderate pain. pain    Historical Provider, MD  lamoTRIgine (LAMICTAL) 200 MG tablet Take 200 mg by mouth 2 (two) times daily. For mood    Historical Provider, MD  lisinopril (PRINIVIL,ZESTRIL) 10 MG tablet Take 10 mg by mouth daily.    Historical Provider, MD  meclizine (ANTIVERT) 25 MG tablet Take 1 tablet (25 mg total) by mouth 4 (four) times daily. 10/15/13   Fredia Sorrow, MD  meloxicam (MOBIC) 7.5 MG tablet Take 7.5 mg by mouth at bedtime.     Historical Provider, MD  Multiple Vitamin (MULTIVITAMIN) tablet  Take 1 tablet by mouth daily.    Historical Provider, MD  nortriptyline (PAMELOR) 25 MG capsule Take 25-50 mg by mouth 2 (two) times daily. Patient takes 2 capsules in the morning and 1 capsule at night    Historical Provider, MD  sertraline (ZOLOFT) 50 MG tablet Take 50 mg by mouth daily.    Historical Provider, MD   BP 131/79 mmHg  Pulse 99  Temp(Src) 99.2 F (37.3 C)  Resp 18  Ht _0  (1.422 m)  Wt 155 lb (70.308 kg)  BMI 34.77 kg/m2  SpO2 100% Physical Exam  Constitutional: She is oriented to person, place, and time. She appears well-developed and well-nourished. No distress.  HENT:  Head: Normocephalic and atraumatic.  Mouth/Throat: Oropharynx is clear and moist. No oropharyngeal exudate.  Diffuse tenderness to palpation of the scalp, no appreciable fluctuance or erythema.  Eyes: Conjunctivae and EOM are normal. Pupils are equal, round, and reactive to light.  Neck: Normal range of motion. Neck supple.  No meningismus.  Cardiovascular: Normal rate, regular rhythm, normal heart sounds and intact distal pulses.   No murmur heard. Pulmonary/Chest: Effort normal and breath sounds normal. No respiratory distress.  Abdominal: Soft. There is no tenderness. There is no rebound and no guarding.  Musculoskeletal: Normal range of motion. She exhibits no edema or tenderness.  Neurological: She is alert and oriented to person, place, and time. No cranial nerve deficit. She exhibits normal muscle tone. Coordination normal.  No ataxia on finger to nose bilaterally. No pronator drift. 5/5 strength throughout. CN 2-12 intact.Equal grip strength. Sensation intact.  Negative Romberg's. Normal gait. No nystagmus.  Skin: Skin is warm.  Psychiatric: She has a normal mood and affect. Her behavior is normal.  Nursing note and vitals reviewed.   ED Course  Procedures (including critical care time)  DIAGNOSTIC STUDIES: Oxygen Saturation is 100% on RA, normal by my interpretation.    COORDINATION  OF CARE: 9:14 PM Discussed treatment plan with pt at bedside and pt agreed to plan.  Labs Review Labs Reviewed  CBC WITH DIFFERENTIAL/PLATELET - Abnormal; Notable for the following:    RBC 3.86 (*)    Platelets 545 (*)    All other components within normal limits  BASIC METABOLIC PANEL - Abnormal; Notable for the following:    Chloride 95 (*)    BUN 21 (*)    Calcium 8.8 (*)    All other components within normal limits  SEDIMENTATION RATE - Abnormal; Notable for the following:    Sed Rate 26 (*)    All other components within normal limits    Imaging Review Ct Angio Head W/cm &/or Wo Cm  10/28/2015  CLINICAL DATA:  Initial evaluation for acute  headache. EXAM: CT ANGIOGRAPHY HEAD AND NECK TECHNIQUE: Multidetector CT  imaging of the head and neck was performed using the standard protocol during bolus administration of intravenous contrast. Multiplanar CT image reconstructions and MIPs were obtained to evaluate the vascular anatomy. Carotid stenosis measurements (when applicable) are obtained utilizing NASCET criteria, using the distal internal carotid diameter as the denominator. CONTRAST:  100 cc of Isovue 370. COMPARISON:  Prior CT from earlier the same day. FINDINGS: CTA NECK Aortic arch: Visualized aortic arch of normal caliber without acute abnormality. Aberrant right subclavian artery present. No high-grade stenosis at the origin of the great vessels. Right carotid system: Right common carotid artery patent from its origin to the bifurcation. Minimal atheromatous irregularity about the right carotid bifurcation without significant stenosis (series 7, image 170). Right ICA widely patent from the bifurcation to the skullbase without stenosis, dissection, or occlusion. Right external carotid artery is branches within normal limits. Left carotid system: Left common carotid artery patent from its origin to the bifurcation. No significant atheromatous plaque about the left bifurcation. Left ICA  widely patent from the bifurcation to the skullbase without stenosis, dissection, or occlusion. Distal left ICA tortuous. Left external carotid artery and its branches within normal limits. Vertebral arteries:Both of the vertebral arteries arise from the subclavian arteries. Vertebral arteries patent within the neck without stenosis, dissection, or occlusion. Left vertebral artery is dominant. Right vertebral artery diffusely hypoplastic. Skeleton: No acute osseous abnormality. No worrisome lytic or blastic osseous lesions. Other neck: Visualized lungs are grossly clear. Visualize mediastinum within normal limits. Thyroid gland normal. No adenopathy within the neck. No acute soft tissue abnormality. CTA HEAD Anterior circulation: Petrous, cavernous, supraclinoid segments of the internal carotid arteries are widely patent without flow-limiting stenosis. Minimal atheromatous plaque within the horizontal petrous right ICA without significant stenosis (series 10, image 89). A1 segments, anterior communicating artery, and anterior cerebral arteries well opacified. M1 segments widely patent without stenosis or occlusion. MCA bifurcations within normal limits. Distal MCA branches well opacified and symmetric. Posterior circulation: The dominant left vertebral artery widely patent to the vertebrobasilar junction. Diminutive right vertebral artery patent as well. Posterior inferior cerebral arteries patent bilaterally. Basilar artery widely patent to its distal aspect. Superior cerebellar arteries patent bilaterally. Both of the posterior cerebral arteries arise knee basilar artery are well opacified to their distal aspects. Venous sinuses: Grossly patent without evidence for venous sinus thrombosis. Anatomic variants: No anatomic variant. No aneurysm or vascular malformation. Delayed phase: No pathologic enhancement. Right frontal encephalomalacia again noted. Post craniotomy changes present at the right frontal calvarium.  IMPRESSION: 1. Negative CTA of the head and neck. No high-grade or flow-limiting stenosis identified. No intracranial aneurysm. 2. 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 3. Aberrant right subclavian artery. Electronically Signed   By: Jeannine Boga M.D.   On: 10/28/2015 00:15   Ct Head Wo Contrast  10/27/2015  CLINICAL DATA:  Previous history of cerebellar hemorrhage. Acute presentation with worsening headache over the last week. EXAM: CT HEAD WITHOUT CONTRAST TECHNIQUE: Contiguous axial images were obtained from the base of the skull through the vertex without intravenous contrast. COMPARISON:  MRI 02/05/2014.  CT 11/23/2013. FINDINGS: Previous right frontal craniotomy. The brainstem and cerebellum are normal. There is atrophy and encephalomalacia in the right frontal region. The remainder the brain appears normal. No evidence of acute infarction, mass lesion, hemorrhage, hydrocephalus or extra-axial collection. No scalp abnormality is seen. Sinuses are clear. IMPRESSION: No change. No acute finding. Old right frontal craniotomy. Right frontal atrophy and encephalomalacia. Electronically Signed  By: Nelson Chimes M.D.   On: 10/27/2015 22:28   Ct Angio Neck W/cm &/or Wo/cm  10/28/2015  CLINICAL DATA:  Initial evaluation for acute  headache. EXAM: CT ANGIOGRAPHY HEAD AND NECK TECHNIQUE: Multidetector CT imaging of the head and neck was performed using the standard protocol during bolus administration of intravenous contrast. Multiplanar CT image reconstructions and MIPs were obtained to evaluate the vascular anatomy. Carotid stenosis measurements (when applicable) are obtained utilizing NASCET criteria, using the distal internal carotid diameter as the denominator. CONTRAST:  100 cc of Isovue 370. COMPARISON:  Prior CT from earlier the same day. FINDINGS: CTA NECK Aortic arch: Visualized aortic arch of normal caliber without acute abnormality. Aberrant right  subclavian artery present. No high-grade stenosis at the origin of the great vessels. Right carotid system: Right common carotid artery patent from its origin to the bifurcation. Minimal atheromatous irregularity about the right carotid bifurcation without significant stenosis (series 7, image 170). Right ICA widely patent from the bifurcation to the skullbase without stenosis, dissection, or occlusion. Right external carotid artery is branches within normal limits. Left carotid system: Left common carotid artery patent from its origin to the bifurcation. No significant atheromatous plaque about the left bifurcation. Left ICA widely patent from the bifurcation to the skullbase without stenosis, dissection, or occlusion. Distal left ICA tortuous. Left external carotid artery and its branches within normal limits. Vertebral arteries:Both of the vertebral arteries arise from the subclavian arteries. Vertebral arteries patent within the neck without stenosis, dissection, or occlusion. Left vertebral artery is dominant. Right vertebral artery diffusely hypoplastic. Skeleton: No acute osseous abnormality. No worrisome lytic or blastic osseous lesions. Other neck: Visualized lungs are grossly clear. Visualize mediastinum within normal limits. Thyroid gland normal. No adenopathy within the neck. No acute soft tissue abnormality. CTA HEAD Anterior circulation: Petrous, cavernous, supraclinoid segments of the internal carotid arteries are widely patent without flow-limiting stenosis. Minimal atheromatous plaque within the horizontal petrous right ICA without significant stenosis (series 10, image 89). A1 segments, anterior communicating artery, and anterior cerebral arteries well opacified. M1 segments widely patent without stenosis or occlusion. MCA bifurcations within normal limits. Distal MCA branches well opacified and symmetric. Posterior circulation: The dominant left vertebral artery widely patent to the  vertebrobasilar junction. Diminutive right vertebral artery patent as well. Posterior inferior cerebral arteries patent bilaterally. Basilar artery widely patent to its distal aspect. Superior cerebellar arteries patent bilaterally. Both of the posterior cerebral arteries arise knee basilar artery are well opacified to their distal aspects. Venous sinuses: Grossly patent without evidence for venous sinus thrombosis. Anatomic variants: No anatomic variant. No aneurysm or vascular malformation. Delayed phase: No pathologic enhancement. Right frontal encephalomalacia again noted. Post craniotomy changes present at the right frontal calvarium. IMPRESSION: 1. Negative CTA of the head and neck. No high-grade or flow-limiting stenosis identified. No intracranial aneurysm. 2. 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 3. Aberrant right subclavian artery. Electronically Signed   By: Jeannine Boga M.D.   On: 10/28/2015 00:15   I have personally reviewed and evaluated these images and lab results as part of my medical decision-making.   EKG Interpretation None      MDM   Final diagnoses:  Headache, unspecified headache type   Intermittent headaches for 1 week and subjective "swelling" feeling to her scalp. Hx cerebellar bleed remotely.  Nonfocal neuro exam. No thunderclap onset. Low suspicion for SAH, meningitis, temporal arteritis. CT negative for acute process.  Toradol, reglan, benadryl given.  ESR 26. Headache improving. Tolerating PO and ambulatory.    Seems to have more scalp tenderness than actual headache.  Consider occiptal neuralgia.   CTA negative for aneurysm. Has neuro follow up next week.  Feels improved and is requesting discharge. Return precautions discussed.   I personally performed the services described in this documentation, which was scribed in my presence. The recorded information has been reviewed and is accurate.    Ezequiel Essex, MD 10/28/15 0157

## 2015-10-28 NOTE — Discharge Instructions (Signed)
General Headache Without Cause Your CT is normal. Follow up with the neurologist as scheduled. Return to the  ED if you develop new or worsening symptoms. A headache is pain or discomfort felt around the head or neck area. The specific cause of a headache may not be found. There are many causes and types of headaches. A few common ones are:  Tension headaches.  Migraine headaches.  Cluster headaches.  Chronic daily headaches. HOME CARE INSTRUCTIONS  Watch your condition for any changes. Take these steps to help with your condition: Managing Pain  Take over-the-counter and prescription medicines only as told by your health care provider.  Lie down in a dark, quiet room when you have a headache.  If directed, apply ice to the head and neck area:  Put ice in a plastic bag.  Place a towel between your skin and the bag.  Leave the ice on for 20 minutes, 2-3 times per day.  Use a heating pad or hot shower to apply heat to the head and neck area as told by your health care provider.  Keep lights dim if bright lights bother you or make your headaches worse. Eating and Drinking  Eat meals on a regular schedule.  Limit alcohol use.  Decrease the amount of caffeine you drink, or stop drinking caffeine. General Instructions  Keep all follow-up visits as told by your health care provider. This is important.  Keep a headache journal to help find out what may trigger your headaches. For example, write down:  What you eat and drink.  How much sleep you get.  Any change to your diet or medicines.  Try massage or other relaxation techniques.  Limit stress.  Sit up straight, and do not tense your muscles.  Do not use tobacco products, including cigarettes, chewing tobacco, or e-cigarettes. If you need help quitting, ask your health care provider.  Exercise regularly as told by your health care provider.  Sleep on a regular schedule. Get 7-9 hours of sleep, or the amount  recommended by your health care provider. SEEK MEDICAL CARE IF:   Your symptoms are not helped by medicine.  You have a headache that is different from the usual headache.  You have nausea or you vomit.  You have a fever. SEEK IMMEDIATE MEDICAL CARE IF:   Your headache becomes severe.  You have repeated vomiting.  You have a stiff neck.  You have a loss of vision.  You have problems with speech.  You have pain in the eye or ear.  You have muscular weakness or loss of muscle control.  You lose your balance or have trouble walking.  You feel faint or pass out.  You have confusion.   This information is not intended to replace advice given to you by your health care provider. Make sure you discuss any questions you have with your health care provider.   Document Released: 04/09/2005 Document Revised: 12/29/2014 Document Reviewed: 08/02/2014 Elsevier Interactive Patient Education [employer].

## 2015-11-01 ENCOUNTER — Ambulatory Visit: Payer: BLUE CROSS/BLUE SHIELD | Admitting: Neurology

## 2015-11-23 ENCOUNTER — Encounter (HOSPITAL_COMMUNITY): Payer: Self-pay | Admitting: Emergency Medicine

## 2015-11-23 ENCOUNTER — Emergency Department (HOSPITAL_COMMUNITY)
Admission: EM | Admit: 2015-11-23 | Discharge: 2015-11-23 | Disposition: A | Discharge status: Home / Self Care | Payer: BLUE CROSS/BLUE SHIELD | Attending: Emergency Medicine | Admitting: Emergency Medicine

## 2015-11-23 ENCOUNTER — Emergency Department (HOSPITAL_COMMUNITY): Payer: BLUE CROSS/BLUE SHIELD

## 2015-11-23 DIAGNOSIS — S0083XA Contusion of other part of head, initial encounter: Secondary | ICD-10-CM | POA: Diagnosis not present

## 2015-11-23 DIAGNOSIS — Y939 Activity, unspecified: Secondary | ICD-10-CM | POA: Insufficient documentation

## 2015-11-23 DIAGNOSIS — I10 Essential (primary) hypertension: Secondary | ICD-10-CM | POA: Insufficient documentation

## 2015-11-23 DIAGNOSIS — Z79899 Other long term (current) drug therapy: Secondary | ICD-10-CM | POA: Diagnosis not present

## 2015-11-23 DIAGNOSIS — Y929 Unspecified place or not applicable: Secondary | ICD-10-CM | POA: Diagnosis not present

## 2015-11-23 DIAGNOSIS — W06XXXA Fall from bed, initial encounter: Secondary | ICD-10-CM | POA: Diagnosis not present

## 2015-11-23 DIAGNOSIS — Y999 Unspecified external cause status: Secondary | ICD-10-CM | POA: Insufficient documentation

## 2015-11-23 DIAGNOSIS — S7012XA Contusion of left thigh, initial encounter: Secondary | ICD-10-CM | POA: Diagnosis not present

## 2015-11-23 DIAGNOSIS — R51 Headache: Secondary | ICD-10-CM | POA: Insufficient documentation

## 2015-11-23 DIAGNOSIS — M79652 Pain in left thigh: Secondary | ICD-10-CM | POA: Diagnosis present

## 2015-11-23 DIAGNOSIS — F17210 Nicotine dependence, cigarettes, uncomplicated: Secondary | ICD-10-CM | POA: Insufficient documentation

## 2015-11-23 DIAGNOSIS — Z791 Long term (current) use of non-steroidal anti-inflammatories (NSAID): Secondary | ICD-10-CM | POA: Insufficient documentation

## 2015-11-23 DIAGNOSIS — Z7982 Long term (current) use of aspirin: Secondary | ICD-10-CM | POA: Insufficient documentation

## 2015-11-23 DIAGNOSIS — W19XXXA Unspecified fall, initial encounter: Secondary | ICD-10-CM

## 2015-11-23 NOTE — ED Triage Notes (Addendum)
Pt reports rolled out of bed x3 days ago. Pt reports headache and intermittent confusion "for awhile". Pt alert and oriented. Large bruise noted to left thigh and right cheek. Pt denies loc. nad noted. Pt reports has appointment with neurologist in a few weeks. Pt denies being on any blood thinners.

## 2015-11-23 NOTE — Discharge Instructions (Signed)
Please put warm compresses on your thigh hematoma. There is a hematoma continues to grow and extend causing worsening pain please see your primary care provider immediately. Able to get in with him please return to the emergency department for evaluation.

## 2015-11-23 NOTE — ED Notes (Signed)
MD at bedside. 

## 2015-11-23 NOTE — ED Provider Notes (Signed)
[facility] DEPT Provider Note   CSN: [id number] Arrival date & time: 11/23/15  [id number]  First Provider Contact:  First MD Initiated Contact with Patient 11/23/15 [phone]     By signing my name below, I, Higinio Plan, attest that this documentation has been prepared under the direction and in the presence of Fatima Blank, MD . Electronically Signed: Higinio Plan, Scribe. 11/23/2015. 9:10 AM.  History   Chief Complaint Chief Complaint  Patient presents with  . Fall   The history is provided by the patient. No language interpreter was used.  Fall  The current episode started 2 days ago. The problem has been gradually worsening. Associated symptoms include headaches. Pertinent negatives include no chest pain, no abdominal pain and no shortness of breath. The symptoms are aggravated by walking. Nothing relieves the symptoms. She has tried a warm compress for the symptoms.   HPI Comments: Theresa Mullins is a 45 y.o. female with PMHx of HTN, bipolar disorder and cerebellar bleed, who presents to the Emergency Department complaining of gradually worsening, left inner thigh pain and recurrent, unchanged, headache s/p a fall that occurred 2 days ago and worsened last night. She notes associated swelling and bruising to her left inner thigh. Pt reports she fell out of her bed 2 days ago after getting caught in her bed sheets landing on her room dresser and trash can. She states she struck her right cheek during her fall but denies losing consciousness; she notes associated bruising to her right cheek as well. She states she was able to get up on her own and ambulate without difficulty after her fall. She reports walking and sitting exacerbates her  pain now.   Pt reports hx of headache and confusion for several years; she notes she was seen in the ED on 7/6 for these symptoms. She states she began to experience confusion again soon after her fall. 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 Per nursing note, she has an appointment with her neurologist in a few weeks. Pt denies any changes in her vision, vomiting, nausea, chest pain, shortness of breath, abdominal pain, dysuria, diarrhea, constipation, urinary or bowel incontinence and use of blood thinners. She states she is currently on her menstrual period.   Past Medical History:  Diagnosis Date  . Anxiety   . Cerebellar bleed ([place])   . Depression   . High cholesterol   . Hypertension   . Vertigo     Patient Active Problem List   Diagnosis Date Noted  . Colitis 03/20/2012  . Rectal bleeding 03/20/2012  . Hypokalemia 03/20/2012  . Hypertension 03/20/2012  . Anxiety 03/20/2012    Past Surgical History:  Procedure Laterality Date  . BRAIN SURGERY      OB History    No data available     Home Medications    Prior to Admission medications   Medication Sig Start Date End Date Taking? Authorizing Provider  acetaminophen (TYLENOL) 500 MG tablet Take 1,500 mg by mouth daily as needed for headache.   Yes Historical Provider, MD  ALPRAZolam Duanne Moron) 1 MG tablet Take 1 mg by mouth daily as needed for anxiety or sleep.    Yes Historical Provider, MD  Aspirin-Salicylamide-Caffeine (BC HEADACHE POWDER PO) Take 1 Package by mouth daily as needed (headache).   Yes Historical Provider, MD  diazepam (VALIUM) 10 MG tablet Take 10  mg by mouth every 8 (eight) hours as needed for anxiety.   Yes Historical Provider, MD  doxycycline (VIBRA-TABS) 100 MG tablet Take 2 tablets by mouth at bedtime. 11/22/15  Yes Historical Provider, MD  ferrous sulfate 325 (65 FE) MG tablet Take 325 mg by mouth at bedtime.   Yes Historical Provider, MD  furosemide (LASIX) 20 MG tablet Take 20 mg by mouth every evening.    Yes Historical Provider, MD  gemfibrozil (LOPID) 600 MG tablet Take 600 mg by mouth at bedtime.    Yes Historical Provider, MD  ibuprofen (ADVIL,MOTRIN) 800  MG tablet Take 800 mg by mouth every 8 (eight) hours as needed for moderate pain.   Yes Historical Provider, MD  lamoTRIgine (LAMICTAL) 200 MG tablet Take 200 mg by mouth 2 (two) times daily. For mood   Yes Historical Provider, MD  lisinopril (PRINIVIL,ZESTRIL) 10 MG tablet Take 10 mg by mouth daily.   Yes Historical Provider, MD  meclizine (ANTIVERT) 25 MG tablet Take 1 tablet (25 mg total) by mouth 4 (four) times daily. Patient taking differently: Take 50 mg by mouth daily as needed for dizziness.  10/15/13  Yes Fredia Sorrow, MD  methylphenidate (RITALIN) 10 MG tablet Take 10 mg by mouth 2 (two) times daily. 11/22/15  Yes Historical Provider, MD  Multiple Vitamin (MULTIVITAMIN) tablet Take 1 tablet by mouth daily.   Yes Historical Provider, MD  nortriptyline (PAMELOR) 25 MG capsule Take 25-50 mg by mouth 2 (two) times daily. Patient takes 2 capsules in the morning and 1 capsule at night   Yes Historical Provider, MD  sertraline (ZOLOFT) 50 MG tablet Take 50 mg by mouth daily.   Yes Historical Provider, MD  diazepam (VALIUM) 5 MG tablet Take 1 tablet (5 mg total) by mouth every 8 (eight) hours as needed (vertigo). Patient not taking: Reported on 11/23/2015 11/23/13   Merryl Hacker, MD   Family History History reviewed. No pertinent family history.  Social History Social History  Substance Use Topics  . Smoking status: Current Every Day Smoker    Packs/day: 0.50    Types: Cigarettes  . Smokeless tobacco: Never Used     Comment: last cigarette used 10/27/15  . Alcohol use No   Allergies   Ciprofloxacin and Remeron [mirtazapine]  Review of Systems Review of Systems  Eyes: Negative for visual disturbance.  Respiratory: Negative for shortness of breath.   Cardiovascular: Negative for chest pain.  Gastrointestinal: Negative for abdominal pain, constipation, diarrhea, nausea and vomiting.  Genitourinary: Negative for dysuria.  Musculoskeletal: Positive for myalgias.  Neurological:  Positive for headaches. Negative for syncope.  Psychiatric/Behavioral: Positive for confusion.   Physical Exam Updated Vital Signs BP 136/87 (BP Location: Left Arm)   Pulse 93   Temp 97.7 F (36.5 C) (Oral)   Resp 18   Ht 4\' 7"  (1.397 m)   Wt 158 lb (71.7 kg)   LMP 11/23/2015   SpO2 100%   BMI 36.72 kg/m   Physical Exam  Constitutional: She is oriented to person, place, and time. She appears well-developed and well-nourished. No distress. Cervical collar in place.  HENT:  Head: Normocephalic and atraumatic.  Right Ear: External ear normal.  Left Ear: External ear normal.  Nose: Nose normal.  Eyes: Conjunctivae and EOM are normal. Pupils are equal, round, and reactive to light. Right eye exhibits no discharge. Left eye exhibits no discharge. No scleral icterus.  Neck: Normal range of motion. Neck supple. No tracheal deviation present.  Cardiovascular:  Normal rate, regular rhythm and normal heart sounds.  Exam reveals no gallop and no friction rub.   No murmur heard. Pulses:      Radial pulses are 2+ on the right side, and 2+ on the left side.       Dorsalis pedis pulses are 2+ on the right side, and 2+ on the left side.  Pulmonary/Chest: Effort normal and breath sounds normal. No stridor. No respiratory distress. She has no wheezes.  Abdominal: Soft. She exhibits no distension. There is no tenderness. There is no guarding.  Musculoskeletal: She exhibits no edema or tenderness.       Cervical back: She exhibits no bony tenderness.       Thoracic back: She exhibits no bony tenderness.       Lumbar back: She exhibits no bony tenderness.  Large ecchymosis with central hematoma ~4-5 cm in diameter on left inner thigh Eecchymosis to right cheek  Neurological: She is alert and oriented to person, place, and time.  Mental Status: Alert and oriented to person, place, and time. Attention and concentration normal. Speech clear. Recent memory is intac  Cranial Nerves  II Visual Fields:  Intact to confrontation. Visual fields intact. III, IV, VI: Pupils equal and reactive to light and near. Full eye movement without nystagmus  V Facial Sensation: Normal. No weakness of masticatory muscles  VII: No facial weakness or asymmetry  VIII Auditory Acuity: Grossly normal  IX/X: The uvula is midline; the palate elevates symmetrically  XI: Normal sternocleidomastoid and trapezius strength  XII: The tongue is midline. No atrophy or fasciculations.   Motor System: Muscle Strength: 5/5 and symmetric in the upper and lower extremities. No pronation or drift.  Muscle Tone: Tone and muscle bulk are normal in the upper and lower extremities.   Reflexes: DTRs: 2+ and symmetrical in all four extremities. Plantar responses are flexor bilaterally.  Coordination: Intact finger-to-nose, heel-to-shin, and rapid alternating movements. No tremor.  Sensation: Intact to light touch, and pinprick. Negative Romberg test.  Gait: Routine gait are normal    Skin: Skin is warm and dry. No rash noted. She is not diaphoretic. No erythema.  Psychiatric: She has a normal mood and affect.  Nursing note and vitals reviewed.  ED Treatments / Results  Labs (all labs ordered are listed, but only abnormal results are displayed) Labs Reviewed - No data to display  EKG  EKG Interpretation None      Radiology Ct Head Wo Contrast  Result Date: 11/23/2015 CLINICAL DATA:  Patient rolled out of bed 3 days ago hitting RIGHT side of head. Headache and intermittent confusion. EXAM: CT HEAD WITHOUT CONTRAST TECHNIQUE: Contiguous axial images were obtained from the base of the skull through the vertex without intravenous contrast. COMPARISON:  None. FINDINGS: No evidence for acute infarction, hemorrhage, mass lesion, hydrocephalus, or extra-axial fluid. Premature for age cerebral and cerebellar atrophy. Extensive encephalomalacia RIGHT frontal lobe, with overlying RIGHT frontal craniotomy, of uncertain etiology. This  abnormality was acute in 2006. Aside from the craniotomy, calvarium is intact. No sinus or mastoid disease. Negative orbits. 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 No acute intracranial findings. Premature for age cerebral and cerebellar atrophy. Electronically Signed   By: Staci Righter M.D.   On: 11/23/2015 10:28   Procedures Procedures  DIAGNOSTIC STUDIES:  Oxygen Saturation is 100% on RA, normal by my interpretation.    COORDINATION OF CARE:  8:59 AM Discussed treatment plan with pt at bedside  and pt agreed to plan.  Medications Ordered in ED Medications - No data to display   Initial Impression / Assessment and Plan / ED Course  I have reviewed the triage vital signs and the nursing notes.  Pertinent labs & imaging results that were available during my care of the patient were reviewed by me and considered in my medical decision making (see chart for details).  Clinical Course    Fall from bed with no LOC or emergency event. Patient does report history of prior intracranial hemorrhage 10 years ago status post craniotomy no complications since then other than recurrent headache and mild confusion. Since the fall patient has had intermittent headache that has not changed from her baseline. No change in vision, hearing, speech. No dizziness. No focal deficits reported. Grossly history as above.  Exam notable for large ecchymosis of the left inner thigh with 4-5 cm central hematoma that is moderately tender to palpation. Patient also has right cheek ecchymosis. Otherwise at her examination revealed no other injuries. Rest of the exam as above.  Given prior Hx of unprovoked ICH, will obtain head CT to assess for small bleed.  CT head: with no acute injury.  Patient was provided education on hematoma. She was given strict return precautions regarding mental status and hematomas as well.   At this time I feel the patient is  medically for discharge with strict return precautions. She is to follow-up with her primary care as needed and her neurologist for her headache as needed.   I personally performed the services described in this documentation, which was scribed in my presence. The recorded information has been reviewed and is accurate.       Final Clinical Impressions(s) / ED Diagnoses   Final diagnoses:  Fall, initial encounter  Thigh hematoma, left, initial encounter  Traumatic ecchymosis of face, initial encounter   Disposition: Discharge  Condition: Good  I have discussed the results, Dx and Tx plan with the patient who expressed understanding and agree(s) with the plan. Discharge instructions discussed at great length. The patient was given strict return precautions who verbalized understanding of the instructions. No further questions at time of discharge.    Discharge Medication List as of 11/23/2015 10:51 AM      Follow Up: Terald Sleeper, PA [address] [place] [address] [phone]  Call  As needed      Fatima Blank, MD 11/23/15 2108

## 2015-11-23 NOTE — ED Notes (Signed)
Patient with no complaints at this time. Respirations even and unlabored. Skin warm/dry. Discharge instructions reviewed with patient at this time. Patient given opportunity to voice concerns/ask questions. Patient discharged at this time and left Emergency Department with steady gait.   

## 2015-11-27 ENCOUNTER — Emergency Department (HOSPITAL_COMMUNITY): Payer: BLUE CROSS/BLUE SHIELD

## 2015-11-27 ENCOUNTER — Emergency Department (HOSPITAL_COMMUNITY)
Admission: EM | Admit: 2015-11-27 | Discharge: 2015-11-27 | Disposition: A | Discharge status: Home / Self Care | Payer: BLUE CROSS/BLUE SHIELD | Attending: Emergency Medicine | Admitting: Emergency Medicine

## 2015-11-27 ENCOUNTER — Encounter (HOSPITAL_COMMUNITY): Payer: Self-pay

## 2015-11-27 DIAGNOSIS — F17210 Nicotine dependence, cigarettes, uncomplicated: Secondary | ICD-10-CM | POA: Diagnosis not present

## 2015-11-27 DIAGNOSIS — Z79899 Other long term (current) drug therapy: Secondary | ICD-10-CM | POA: Diagnosis not present

## 2015-11-27 DIAGNOSIS — R0602 Shortness of breath: Secondary | ICD-10-CM | POA: Insufficient documentation

## 2015-11-27 DIAGNOSIS — I10 Essential (primary) hypertension: Secondary | ICD-10-CM | POA: Insufficient documentation
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
## 2015-11-27 NOTE — ED Notes (Signed)
Pt alert & oriented x4, stable gait. Patient given discharge instructions, paperwork & prescription(s). Patient  instructed to stop at the registration desk to finish any additional paperwork. Patient verbalized understanding. Pt left department w/ no further questions. 

## 2015-12-07 ENCOUNTER — Encounter: Payer: Self-pay | Admitting: Neurology

## 2015-12-07 ENCOUNTER — Encounter: Payer: Self-pay | Admitting: *Deleted

## 2015-12-07 ENCOUNTER — Ambulatory Visit (INDEPENDENT_AMBULATORY_CARE_PROVIDER_SITE_OTHER): Payer: BLUE CROSS/BLUE SHIELD | Admitting: Neurology

## 2015-12-07 VITALS — BP 106/76 | HR 104 | Ht <= 58 in | Wt 150.2 lb

## 2015-12-07 DIAGNOSIS — I629 Nontraumatic intracranial hemorrhage, unspecified: Secondary | ICD-10-CM

## 2015-12-07 DIAGNOSIS — G43709 Chronic migraine without aura, not intractable, without status migrainosus: Secondary | ICD-10-CM | POA: Diagnosis not present

## 2015-12-07 DIAGNOSIS — F3177 Bipolar disorder, in partial remission, most recent episode mixed: Secondary | ICD-10-CM

## 2015-12-07 DIAGNOSIS — R930 Abnormal findings on diagnostic imaging of skull and head, not elsewhere classified: Secondary | ICD-10-CM

## 2015-12-07 MED ORDER — DIVALPROEX SODIUM ER 500 MG PO TB24: 500.0000 mg | ORAL_TABLET | Freq: Every day | ORAL | 11 refills | Status: DC

## 2015-12-07 MED ORDER — BUTALBITAL-APAP-CAFFEINE 50-325-40 MG PO TABS: 1.0000 | ORAL_TABLET | Freq: Four times a day (QID) | ORAL | 3 refills | Status: AC | PRN

## 2015-12-07 NOTE — Progress Notes (Signed)
PATIENT: Theresa Mullins DOB: 1970-07-23  Chief Complaint  Patient presents with  . Migraine    She has been experiencing migraines weekly.  These headaches are associated with dizziness and nausea.  She takes ibuprofen which minimizes the pain.  . Confusion Spells    MMSE 27/30 - 16 animals.  Reports intermittent episodes of confusion.  . Gait Problem    She has an unsteady gait and that has caused several falls.     HISTORICAL  Theresa Mullins  Is a 45 years old right-handed female, seen in refer by her primary care  Terald Sleeper for evaluation of frequent headaches, confusion spells, fall on August 16th 2017.  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 She also had a history of depression anxiety, has been treated with lamotrigine 200 mg twice a day for few years  Per patient, she was able to go back to work few months after her intracranial bleeding, last time she went to work was July 06 2015, she worked at [employer], around beginning of 2017, she noticed intermittent confusion episode, right eye jumping, clumsiness of right hand, intermittent unbalanced gait. She also reported increased headache, bilateral frontal retro-orbital area, on a daily basis, she denied loss of consciousness, seizure-like activity.  I personally reviewed most recent MRI of the brain in 2015, CAT scan in July 2017, right frontal encephalomalacia in no acute abnormality.  She was also noted to have a small jaw, shot status, complains of snoring, excessive daytime fatigue and sleepiness.  REVIEW OF SYSTEMS: Full 14 system review of systems performed and  notable only for as above  ALLERGIES: Allergies  Allergen Reactions  . Ciprofloxacin Anaphylaxis  . Remeron [Mirtazapine]     Elevated blood pressure    HOME MEDICATIONS: Current Outpatient Prescriptions  Medication Sig Dispense Refill  . acetaminophen (TYLENOL) 500 MG tablet Take 1,500 mg by mouth daily as needed for headache.    . diazepam (VALIUM) 10 MG tablet Take 10 mg by mouth every 8 (eight) hours as needed for anxiety.    Marland Kitchen doxycycline (VIBRA-TABS) 100 MG tablet Take 2 tablets by mouth at bedtime. *Only takes when there is a flare of infection (boil)  4  . ferrous sulfate 325 (65 FE) MG tablet Take 325 mg by mouth at bedtime.    . furosemide (LASIX) 20 MG tablet Take 20 mg by mouth every evening.     Marland Kitchen gemfibrozil (LOPID) 600 MG tablet Take 600 mg by mouth at bedtime.     . lamoTRIgine (LAMICTAL) 200 MG tablet Take 200 mg by mouth 2 (two) times daily. For mood    . lisinopril (PRINIVIL,ZESTRIL) 10 MG tablet Take 10 mg by mouth daily.    . meclizine (ANTIVERT) 25 MG tablet Take 1 tablet (25 mg total) by mouth 4 (four) times daily. (Patient taking differently: Take 50 mg by mouth every morning. *will take two additional tablets daily if needed for dizziness) 28 tablet 0  . methylphenidate (RITALIN) 10 MG tablet Take 10 mg by mouth every morning.   0  . Multiple Vitamin (MULTIVITAMIN) tablet Take 1 tablet by mouth 2 (two) times daily.     . nortriptyline (  PAMELOR) 25 MG capsule Take 25-50 mg by mouth 2 (two) times daily. Patient takes 2 capsules in the morning and 1 capsule at night    . sertraline (ZOLOFT) 50 MG tablet Take 50 mg by mouth at bedtime.      No current facility-administered medications for this visit.     PAST MEDICAL HISTORY: Past Medical History:  Diagnosis Date  . ADD (attention deficit disorder)   . Anxiety   . Anxiety   . Carpal tunnel syndrome   . Cerebellar bleed ([place])   . Depression   . DUB (dysfunctional uterine bleeding)   . Dysmenorrhea   . High  cholesterol   . Hip pain   . Hypertension   . Joint pain   . Mood disorder ([place])   . Restless legs   . Vertigo     PAST SURGICAL HISTORY: Past Surgical History:  Procedure Laterality Date  . BRAIN SURGERY      FAMILY HISTORY: Family History  Problem Relation Age of Onset  . Mental illness Mother   . Hypertension Mother   . Depression Mother   . CAD Mother   . Diabetes Mother   . Melanoma Mother   . Mental illness Father   . Heart attack Father   . Aneurysm Father     SOCIAL HISTORY:  Social History   Social History  . Marital status: Single    Spouse name: N/A  . Number of children: 0  . Years of education: GED   Occupational History  . Disabled    Social History Main Topics  . Smoking status: Current Every Day Smoker    Packs/day: 0.50    Types: Cigarettes  . Smokeless tobacco: Never Used     Comment: last cigarette used 10/27/15  . Alcohol use No  . Drug use: No  . Sexual activity: Not on file   Other Topics Concern  . Not on file   Social History Narrative   Lives at home with her boyfriend.   Right-handed.   No caffeine use.     PHYSICAL EXAM   Vitals:   12/07/15 1001  BP: 106/76  Pulse: (!) 104  Weight: 150 lb 4 oz (68.2 kg)  Height: 4\' 7"  (1.397 m)    Not recorded      Body mass index is 34.92 kg/m.  PHYSICAL EXAMNIATION:  Gen: NAD, conversant, well nourised, obese, well groomed                     Cardiovascular: Regular rate rhythm, no peripheral edema, warm, nontender. Eyes: Conjunctivae clear without exudates or hemorrhage Neck: Supple, no carotid bruise. Pulmonary: Clear to auscultation bilaterally   NEUROLOGICAL EXAM:  MENTAL STATUS: Speech:    Speech is normal; fluent and spontaneous with normal comprehension.  Cognition:     Orientation to time, place and person     Normal recent and remote memory     Normal Attention span and concentration     Normal Language, naming, repeating,spontaneous speech     Fund of  knowledge   CRANIAL NERVES: CN II: Visual fields are full to confrontation. Fundoscopic exam is normal with sharp discs and no vascular changes. Pupils are round equal and briskly reactive to light. CN III, IV, VI: extraocular movement are normal. No ptosis. CN V: Facial sensation is intact to pinprick in all 3 divisions bilaterally. Corneal responses are intact.  CN VII: Face is symmetric with normal eye closure and smile. CN  VIII: Hearing is normal to rubbing fingers CN IX, X: Palate elevates symmetrically. Phonation is normal. CN XI: Head turning and shoulder shrug are intact CN XII: Tongue is midline with normal movements and no atrophy.  MOTOR: There is no pronator drift of out-stretched arms. Muscle bulk and tone are normal. Muscle strength is normal.  REFLEXES: Reflexes are 2+ and symmetric at the biceps, triceps, knees, and ankles. Plantar responses are flexor.  SENSORY: Intact to light touch, pinprick, positional sensation and vibratory sensation are intact in fingers and toes.  COORDINATION: Rapid alternating movements and fine finger movements are intact. There is no dysmetria on finger-to-nose and heel-knee-shin.    GAIT/STANCE: Posture is normal. Gait is steady with normal steps, base, arm swing, and turning. Heel and toe walking are normal. Tandem gait is normal.  Romberg is absent.   DIAGNOSTIC DATA (LABS, IMAGING, TESTING) - I reviewed patient records, labs, notes, testing and imaging myself where available.   ASSESSMENT AND PLAN  Ceren Hornbaker is a 45 y.o. 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 Ph.D.  [facility] [address] [place], [place] [address] Ph: [phone] Fax: [phone]  CC: Terald Sleeper, PA

## 2015-12-08 LAB — HGB A1C W/O EAG: HEMOGLOBIN A1C: 5.3 % (ref 4.8–5.6)

## 2015-12-08 LAB — VITAMIN B12: Vitamin B-12: 1052 pg/mL — ABNORMAL HIGH (ref 211–946)

## 2015-12-08 LAB — CK: Total CK: 83 U/L (ref 24–173)

## 2015-12-08 LAB — THYROID PANEL WITH TSH
FREE THYROXINE INDEX: 0.8 — AB (ref 1.2–4.9)
T3 UPTAKE RATIO: 24 % (ref 24–39)
T4, Total: 3.4 ug/dL — ABNORMAL LOW (ref 4.5–12.0)
TSH: 1.18 u[IU]/mL (ref 0.450–4.500)

## 2015-12-08 LAB — SEDIMENTATION RATE: Sed Rate: 7 mm/hr (ref 0–32)

## 2015-12-08 LAB — C-REACTIVE PROTEIN: CRP: 5.4 mg/L — ABNORMAL HIGH (ref 0.0–4.9)

## 2015-12-08 LAB — RPR: RPR Ser Ql: NONREACTIVE

## 2015-12-08 NOTE — Progress Notes (Signed)
Correction: no significant abnormalities

## 2015-12-20 ENCOUNTER — Telehealth: Payer: Self-pay | Admitting: Neurology

## 2015-12-20 NOTE — Telephone Encounter (Signed)
Pt returned Danielle's call. Pt advised she would not have the money for an appt tomorrow but she does want to discuss it. Please call

## 2015-12-21 NOTE — Telephone Encounter (Signed)
Pt called Danielle back

## 2015-12-21 NOTE — Telephone Encounter (Signed)
Pt called in wanting a call back to discuss how much the MRI will be. Please call

## 2015-12-22 ENCOUNTER — Other Ambulatory Visit: Payer: Self-pay | Admitting: Physician Assistant

## 2015-12-22 NOTE — Telephone Encounter (Signed)
Pt called in to ask how much she will have to pay for MRI .

## 2015-12-30 ENCOUNTER — Telehealth: Payer: Self-pay | Admitting: Neurology

## 2015-12-30 NOTE — Telephone Encounter (Signed)
I returned the patients call and left a VM asking her to call me back. I had originally called the patient to go over benefits and schedule (noted in the order notes).

## 2015-12-30 NOTE — Telephone Encounter (Signed)
Sharyn Lull, I scheduled this patient today for MRI she stated that last time she had contrast she broke out in a rash. Please call and advise.

## 2016-01-02 ENCOUNTER — Other Ambulatory Visit: Payer: Self-pay | Admitting: *Deleted

## 2016-01-02 DIAGNOSIS — I629 Nontraumatic intracranial hemorrhage, unspecified: Secondary | ICD-10-CM

## 2016-01-02 NOTE — Telephone Encounter (Signed)
Per Dr. Krista Blue, change MRI to brain w/o contrast.  Patient aware and agreeable.

## 2016-01-02 NOTE — Telephone Encounter (Addendum)
Left message for a return call.  MRI pending 01/04/16.

## 2016-01-04 ENCOUNTER — Ambulatory Visit (INDEPENDENT_AMBULATORY_CARE_PROVIDER_SITE_OTHER): Payer: BLUE CROSS/BLUE SHIELD

## 2016-01-04 DIAGNOSIS — I629 Nontraumatic intracranial hemorrhage, unspecified: Secondary | ICD-10-CM | POA: Diagnosis not present

## 2016-01-05 NOTE — Telephone Encounter (Signed)
Called patient back, she stated that she had received a missed call from our office. I told her that it may have a reminder call for her apt  Yesterday.

## 2016-01-05 NOTE — Telephone Encounter (Signed)
Pt returned Danielle's call °

## 2016-01-06 NOTE — Telephone Encounter (Signed)
Please call patient, there was no significant change on brain MRI compared to 2015, evidence of right frontal scar, I will review detail at her next follow-up visit  IMPRESSION:  Abnormal MRI brain (without) demonstrating: 1. Right frontal cystic encephalomalacia and gliosis from prior intracerebral hemorrhage. This communicates with the right frontal horn of lateral ventricle.  2. Scattered subcortical foci of non-specific gliosis.  3. No acute findings. 4. Compared to MRI on 02/05/14, no significant change.

## 2016-01-09 NOTE — Telephone Encounter (Signed)
Left detailed message with results and to keep her pending appts (ok per DPR).  Also, provided our number to call back with any questions.

## 2016-01-10 ENCOUNTER — Ambulatory Visit (INDEPENDENT_AMBULATORY_CARE_PROVIDER_SITE_OTHER): Payer: BLUE CROSS/BLUE SHIELD | Admitting: Neurology

## 2016-01-10 DIAGNOSIS — I629 Nontraumatic intracranial hemorrhage, unspecified: Secondary | ICD-10-CM | POA: Diagnosis not present

## 2016-01-10 DIAGNOSIS — R930 Abnormal findings on diagnostic imaging of skull and head, not elsewhere classified: Secondary | ICD-10-CM

## 2016-01-12 ENCOUNTER — Telehealth: Payer: Self-pay | Admitting: Neurology

## 2016-01-12 NOTE — Telephone Encounter (Signed)
Spoke to patient at 11:45am - she developed a migraine last night.  She has not tried taking any medication for her pain.  She is going to take Fioricet and lay down to rest.  If her headache does not improve, then she was instructed to call us back.

## 2016-01-12 NOTE — Telephone Encounter (Signed)
Pt called in stating she is having a really bad headache and there is a burning sensation in right eye. Please call and advise

## 2016-01-17 ENCOUNTER — Encounter (HOSPITAL_COMMUNITY): Payer: Self-pay | Admitting: Emergency Medicine

## 2016-01-17 ENCOUNTER — Emergency Department (HOSPITAL_COMMUNITY): Payer: BLUE CROSS/BLUE SHIELD

## 2016-01-17 ENCOUNTER — Telehealth: Payer: Self-pay | Admitting: Neurology

## 2016-01-17 ENCOUNTER — Emergency Department (HOSPITAL_COMMUNITY)
Admission: EM | Admit: 2016-01-17 | Discharge: 2016-01-17 | Disposition: A | Discharge status: Home / Self Care | Payer: BLUE CROSS/BLUE SHIELD | Attending: Emergency Medicine | Admitting: Emergency Medicine

## 2016-01-17 DIAGNOSIS — F17210 Nicotine dependence, cigarettes, uncomplicated: Secondary | ICD-10-CM | POA: Insufficient documentation

## 2016-01-17 DIAGNOSIS — R410 Disorientation, unspecified: Secondary | ICD-10-CM | POA: Diagnosis not present

## 2016-01-17 DIAGNOSIS — R112 Nausea with vomiting, unspecified: Secondary | ICD-10-CM | POA: Diagnosis not present

## 2016-01-17 DIAGNOSIS — Z791 Long term (current) use of non-steroidal anti-inflammatories (NSAID): Secondary | ICD-10-CM | POA: Insufficient documentation

## 2016-01-17 DIAGNOSIS — I10 Essential (primary) hypertension: Secondary | ICD-10-CM | POA: Insufficient documentation

## 2016-01-17 DIAGNOSIS — Z79899 Other long term (current) drug therapy: Secondary | ICD-10-CM | POA: Insufficient documentation

## 2016-01-17 DIAGNOSIS — R569 Unspecified convulsions: Secondary | ICD-10-CM | POA: Diagnosis present

## 2016-01-17 DIAGNOSIS — F909 Attention-deficit hyperactivity disorder, unspecified type: Secondary | ICD-10-CM | POA: Diagnosis not present

## 2016-01-17 LAB — CBC
HEMATOCRIT: 36.3 % (ref 36.0–46.0)
HEMOGLOBIN: 12.5 g/dL (ref 12.0–15.0)
MCH: 33.1 pg (ref 26.0–34.0)
MCHC: 34.4 g/dL (ref 30.0–36.0)
MCV: 96 fL (ref 78.0–100.0)
Platelets: 436 10*3/uL — ABNORMAL HIGH (ref 150–400)
RBC: 3.78 MIL/uL — ABNORMAL LOW (ref 3.87–5.11)
RDW: 12 % (ref 11.5–15.5)
WBC: 4.8 10*3/uL (ref 4.0–10.5)

## 2016-01-17 LAB — COMPREHENSIVE METABOLIC PANEL
ALBUMIN: 4.2 g/dL (ref 3.5–5.0)
ALK PHOS: 35 U/L — AB (ref 38–126)
ALT: 21 U/L (ref 14–54)
ANION GAP: 11 (ref 5–15)
AST: 23 U/L (ref 15–41)
BUN: 12 mg/dL (ref 6–20)
CHLORIDE: 94 mmol/L — AB (ref 101–111)
CO2: 26 mmol/L (ref 22–32)
Calcium: 8.9 mg/dL (ref 8.9–10.3)
Creatinine, Ser: 1.07 mg/dL — ABNORMAL HIGH (ref 0.44–1.00)
GFR calc Af Amer: >60 mL/min (ref >60)
GFR calc non Af Amer: >60 mL/min (ref >60)
Glucose, Bld: 103 mg/dL — ABNORMAL HIGH (ref 65–99)
POTASSIUM: 3.4 mmol/L — AB (ref 3.5–5.1)
SODIUM: 131 mmol/L — AB (ref 135–145)
TOTAL PROTEIN: 7.6 g/dL (ref 6.5–8.1)
Total Bilirubin: 0.4 mg/dL (ref 0.3–1.2)

## 2016-01-17 LAB — URINALYSIS, ROUTINE W REFLEX MICROSCOPIC
Bilirubin Urine: NEGATIVE
GLUCOSE, UA: NEGATIVE mg/dL
Hgb urine dipstick: NEGATIVE
Ketones, ur: NEGATIVE mg/dL
NITRITE: NEGATIVE
PH: 5.5 (ref 5.0–8.0)
Protein, ur: NEGATIVE mg/dL

## 2016-01-17 LAB — URINE MICROSCOPIC-ADD ON: RBC / HPF: NONE SEEN RBC/hpf (ref 0–5)

## 2016-01-17 LAB — LIPASE, BLOOD: Lipase: 16 U/L (ref 11–51)

## 2016-01-17 LAB — VALPROIC ACID LEVEL: VALPROIC ACID LVL: 35 ug/mL — AB (ref 50.0–100.0)

## 2016-01-17 LAB — CBG MONITORING, ED: Glucose-Capillary: 132 mg/dL — ABNORMAL HIGH (ref 65–99)

## 2016-01-17 MED ORDER — ONDANSETRON 4 MG PO TBDP: 4.0000 mg | ORAL_TABLET | Freq: Three times a day (TID) | ORAL | 0 refills | Status: DC | PRN

## 2016-01-17 MED ORDER — PROMETHAZINE HCL 25 MG/ML IJ SOLN
25.0000 mg | Freq: Once | INTRAMUSCULAR | Status: AC
  Administered 2016-01-17: 25 mg via INTRAVENOUS
  Filled 2016-01-17: qty 1

## 2016-01-17 MED ORDER — SODIUM CHLORIDE 0.9 % IV BOLUS (SEPSIS)
1000.0000 mL | Freq: Once | INTRAVENOUS | Status: AC
  Administered 2016-01-17: 1000 mL via INTRAVENOUS

## 2016-01-17 NOTE — ED Provider Notes (Signed)
[facility] DEPT Provider Note   CSN: [id number] Arrival date & time: 01/17/16  1659     History   Chief Complaint Chief Complaint  Patient presents with  . Emesis    HPI Theresa Mullins is a 45 y.o. female.  Pt presents to the ED with episodes of confusion, weakness, and ambulatory dysfunction.  She has a hx of ICH and has seen Dr. Krista Blue (neurology) for the same.  Dr. Rhea Belton last note indicated that she thought the pt's periods of confusion were complex partial seizures.  She ordered a repeat MRI which was done on 9/14.  The MRI showed no significant change from 2015.  Abnormal MRI brain (without) demonstrating: 1. Right frontal cystic encephalomalacia and gliosis from prior intracerebral hemorrhage. This communicates with the right frontal horn of lateral ventricle.  2. Scattered subcortical foci of non-specific gliosis.  3. No acute findings. 4. Compared to MRI on 02/05/14, no significant change.   The pt came to the ED today b/c she said she was unable to ambulate.  She called EMS and was able to walk with assistance to the truck.  Pt also able to stand up and transfer to the ED bed.      Past Medical History:  Diagnosis Date  . ADD (attention deficit disorder)   . Anxiety   . Anxiety   . Carpal tunnel syndrome   . Cerebellar bleed ([place])   . Depression   . DUB (dysfunctional uterine bleeding)   . Dysmenorrhea   . High cholesterol   . Hip pain   . Hypertension   . Joint pain   . Mood disorder ([place])   . Restless legs   . Vertigo     Patient Active Problem List   Diagnosis Date Noted  . Colitis 03/20/2012  . Rectal bleeding 03/20/2012  . Hypokalemia 03/20/2012  . Hypertension 03/20/2012  . Anxiety 03/20/2012    Past Surgical History:  Procedure Laterality Date  . BRAIN SURGERY      OB History    No data available       Home Medications    Prior to Admission medications   Medication Sig Start Date End Date Taking? Authorizing Provider    acetaminophen (TYLENOL) 500 MG tablet Take 1,500 mg by mouth daily as needed for headache.   Yes Historical Provider, MD  diazepam (VALIUM) 10 MG tablet Take 10 mg by mouth every 8 (eight) hours as needed for anxiety.   Yes Historical Provider, MD  divalproex (DEPAKOTE ER) 500 MG 24 hr tablet Take 1 tablet (500 mg total) by mouth at bedtime. 12/07/15  Yes Marcial Pacas, MD  ferrous sulfate 325 (65 FE) MG tablet Take 325 mg by mouth at bedtime.   Yes Historical Provider, MD  furosemide (LASIX) 20 MG tablet Take 20 mg by mouth every evening.    Yes Historical Provider, MD  gemfibrozil (LOPID) 600 MG tablet Take 600 mg by mouth at bedtime.    Yes Historical Provider, MD  HYDROcodone-acetaminophen (NORCO) 10-325 MG tablet Take 1 tablet by mouth every 6 (six) hours as needed for moderate pain or severe pain.  12/22/15  Yes Historical Provider, MD  ibuprofen (ADVIL,MOTRIN) 200 MG tablet Take 200 mg by mouth every 6 (six) hours as needed for mild pain or moderate pain.   Yes Historical Provider, MD  lamoTRIgine (LAMICTAL) 200 MG tablet Take 200 mg by mouth 2 (two) times daily. For mood   Yes Historical Provider, MD  lisinopril (PRINIVIL,ZESTRIL) 10 MG  tablet Take 10 mg by mouth daily.   Yes Historical Provider, MD  methylphenidate (RITALIN) 10 MG tablet Take 10 mg by mouth every morning.  11/22/15  Yes Historical Provider, MD  Multiple Vitamin (MULTIVITAMIN) tablet Take 1 tablet by mouth 2 (two) times daily.    Yes Historical Provider, MD  nortriptyline (PAMELOR) 25 MG capsule Take 25-50 mg by mouth 2 (two) times daily. Patient takes 2 capsules in the morning and 1 capsule at night   Yes Historical Provider, MD  sertraline (ZOLOFT) 50 MG tablet Take 50 mg by mouth at bedtime.    Yes Historical Provider, MD  butalbital-acetaminophen-caffeine (FIORICET, ESGIC) 50-325-40 MG tablet Take 1 tablet by mouth every 6 (six) hours as needed for headache. Patient not taking: Reported on 01/17/2016 12/07/15   Marcial Pacas, MD   doxycycline (VIBRA-TABS) 100 MG tablet TAKE ONE (1) CAPSULE EACH DAY Patient not taking: Reported on 01/17/2016 12/22/15   Terald Sleeper, PA  ondansetron (ZOFRAN ODT) 4 MG disintegrating tablet Take 1 tablet (4 mg total) by mouth every 8 (eight) hours as needed for nausea or vomiting. 01/17/16   Isla Pence, MD    Family History Family History  Problem Relation Age of Onset  . Mental illness Mother   . Hypertension Mother   . Depression Mother   . CAD Mother   . Diabetes Mother   . Melanoma Mother   . Mental illness Father   . Heart attack Father   . Aneurysm Father     Social History Social History  Substance Use Topics  . Smoking status: Current Every Day Smoker    Packs/day: 0.50    Types: Cigarettes  . Smokeless tobacco: Never Used     Comment: last cigarette used 10/27/15  . Alcohol use No     Allergies   Ciprofloxacin; Remeron [mirtazapine]; and Contrast media [iodinated diagnostic agents]   Review of Systems Review of Systems  Neurological:       Confusion  All other systems reviewed and are negative.    Physical Exam Updated Vital Signs BP 128/82 (BP Location: Left Arm)   Pulse 82   Temp 97.6 F (36.4 C) (Oral)   Resp 20   Ht 4\' 7"  (1.397 m)   Wt 151 lb (68.5 kg)   LMP 12/22/2015 (Approximate)   SpO2 100%   BMI 35.10 kg/m   Physical Exam  Constitutional: She is oriented to person, place, and time. She appears well-developed and well-nourished.  HENT:  Head: Normocephalic and atraumatic.  Right Ear: External ear normal.  Left Ear: External ear normal.  Nose: Nose normal.  Mouth/Throat: Oropharynx is clear and moist.  Eyes: Conjunctivae and EOM are normal. Pupils are equal, round, and reactive to light.  Neck: Normal range of motion. Neck supple.  Cardiovascular: Normal rate, regular rhythm, normal heart sounds and intact distal pulses.   Pulmonary/Chest: Effort normal and breath sounds normal.  Abdominal: Soft. Bowel sounds are normal.   Musculoskeletal: Normal range of motion.  Neurological: She is alert and oriented to person, place, and time.  Skin: Skin is warm and dry.  Psychiatric: She has a normal mood and affect. Her behavior is normal. Judgment and thought content normal.  Nursing note and vitals reviewed.    ED Treatments / Results  Labs (all labs ordered are listed, but only abnormal results are displayed) Labs Reviewed  COMPREHENSIVE METABOLIC PANEL - Abnormal; Notable for the following:       Result Value   Sodium  131 (*)    Potassium 3.4 (*)    Chloride 94 (*)    Glucose, Bld 103 (*)    Creatinine, Ser 1.07 (*)    Alkaline Phosphatase 35 (*)    All other components within normal limits  CBC - Abnormal; Notable for the following:    RBC 3.78 (*)    Platelets 436 (*)    All other components within normal limits  URINALYSIS, ROUTINE W REFLEX MICROSCOPIC (NOT AT [facility]) - Abnormal; Notable for the following:    Color, Urine STRAW (*)    Specific Gravity, Urine <1.005 (*)    Leukocytes, UA SMALL (*)    All other components within normal limits  VALPROIC ACID LEVEL - Abnormal; Notable for the following:    Valproic Acid Lvl 35 (*)    All other components within normal limits  URINE MICROSCOPIC-ADD ON - Abnormal; Notable for the following:    Squamous Epithelial / LPF 6-30 (*)    Bacteria, UA FEW (*)    All other components within normal limits  CBG MONITORING, ED - Abnormal; Notable for the following:    Glucose-Capillary 132 (*)    All other components within normal limits  LIPASE, BLOOD    EKG  EKG Interpretation  Date/Time:  Tuesday January 17 2016 17:18:52 EDT Ventricular Rate:  86 PR Interval:  170 QRS Duration: 80 QT Interval:  420 QTC Calculation: 502 R Axis:   11 Text Interpretation:  Normal sinus rhythm Low voltage QRS Nonspecific T wave abnormality Prolonged QT Abnormal ECG Confirmed by Franklin Clapsaddle MD, Yarah Fuente ([id number]) on 01/17/2016 5:56:23 PM       Radiology Ct Head Wo  Contrast  Result Date: 01/17/2016 CLINICAL DATA:  Ambulatory difficulties.  Weakness for 2 weeks. EXAM: CT HEAD WITHOUT CONTRAST TECHNIQUE: Contiguous axial images were obtained from the base of the skull through the vertex without intravenous contrast. COMPARISON:  Head CT 11/23/2015 FINDINGS: Brain: No mass lesion, intraparenchymal hemorrhage or extra-axial collection. No evidence of acute cortical infarct. Unchanged right frontal encephalomalacia. Vascular: No hyperdense vessel or unexpected calcification. Skull: Remote right frontal craniotomy. Sinuses/Orbits: No sinus fluid levels or advanced mucosal thickening. No mastoid effusion. Normal orbits. IMPRESSION: 1. No acute intracranial abnormality. 2. Unchanged right frontal encephalomalacia. Electronically Signed   By: Ulyses Jarred M.D.   On: 01/17/2016 19:04    Procedures Procedures (including critical care time)  Medications Ordered in ED Medications  sodium chloride 0.9 % bolus 1,000 mL (1,000 mLs Intravenous New Bag/Given 01/17/16 1818)  promethazine (PHENERGAN) injection 25 mg (25 mg Intravenous Given 01/17/16 1820)     Initial Impression / Assessment and Plan / ED Course  I have reviewed the triage vital signs and the nursing notes.  Pertinent labs & imaging results that were available during my care of the patient were reviewed by me and considered in my medical decision making (see chart for details).  Clinical Course    Pt is feeling much better.  She has an appt with Dr. Krista Blue (her neurologist) next week.  She is instructed to keep that appt and to return if worse.  Final Clinical Impressions(s) / ED Diagnoses   Final diagnoses:  Seizure ([place])  Non-intractable vomiting with nausea, vomiting of unspecified type    New Prescriptions New Prescriptions   ONDANSETRON (ZOFRAN ODT) 4 MG DISINTEGRATING TABLET    Take 1 tablet (4 mg total) by mouth every 8 (eight) hours as needed for nausea or vomiting.     Isla Pence,  MD 01/17/16 2010

## 2016-01-17 NOTE — ED Triage Notes (Signed)
Per EMS: Pt reports weakness x 2 weeks, n/v starting, denies diarrhea.  Pt alert and oriented, no unilateral weakness. Pt able to move all extremities freely at this time.   117/71, 92hr, 16rr, 97%ra.  118 cbg.

## 2016-01-17 NOTE — Telephone Encounter (Signed)
Attempted to call patient back three times.  Left messages for a return call.

## 2016-01-17 NOTE — ED Notes (Signed)
EKG given to Dr Haviland. 

## 2016-01-17 NOTE — ED Notes (Signed)
Ambulated patient to the bathroom to get a urine sample.

## 2016-01-17 NOTE — Telephone Encounter (Signed)
Patient called, had recent MRI and EEG and is scheduled to come in next Wednesday for f/u appointment. States that when she woke up just now, she couldn't stand up to walk with walker. Please call [phone].

## 2016-01-17 NOTE — ED Notes (Signed)
Pt is back from CT

## 2016-01-17 NOTE — ED Notes (Signed)
Pt is in CT

## 2016-01-17 NOTE — Telephone Encounter (Signed)
States her legs are "not working at all" and she is unable to get out of the bed.  Her significant other, Loralyn Freshwater, is there with her.  I instructed her to call 911.  She and Mikki Santee verbalized understanding.

## 2016-01-18 ENCOUNTER — Ambulatory Visit (INDEPENDENT_AMBULATORY_CARE_PROVIDER_SITE_OTHER): Payer: BLUE CROSS/BLUE SHIELD | Admitting: Neurology

## 2016-01-18 ENCOUNTER — Encounter: Payer: Self-pay | Admitting: Neurology

## 2016-01-18 VITALS — BP 120/87 | HR 101 | Ht <= 58 in | Wt 146.0 lb

## 2016-01-18 DIAGNOSIS — R42 Dizziness and giddiness: Secondary | ICD-10-CM | POA: Diagnosis not present

## 2016-01-18 DIAGNOSIS — F419 Anxiety disorder, unspecified: Secondary | ICD-10-CM | POA: Diagnosis not present

## 2016-01-18 DIAGNOSIS — R269 Unspecified abnormalities of gait and mobility: Secondary | ICD-10-CM | POA: Diagnosis not present

## 2016-01-18 NOTE — Telephone Encounter (Addendum)
Spoke to patient - she is still feeling weakness in her legs today.  She is using a walker for assistance.  She has completed her EEG and MRI brain.  CT head completed in ED.  She has been added to Dr. Rhea Belton schedule today.

## 2016-01-18 NOTE — Telephone Encounter (Signed)
Pt called back. Sharyn Lull was skyped, she was with a pt but will call her right back

## 2016-01-18 NOTE — Procedures (Addendum)
   HISTORY: 45 year old female with history of right frontal encephalomalacia from previous intracranial bleeding presented with intermittent confusion episode suspicious for partial seizure.  TECHNIQUE:  16 channel EEG was performed based on standard 10-16 international system. One channel was dedicated to EKG, which has demonstrates normal sinus rhythm of  78 beats per minutes.  Upon awakening, the posterior background activity was well-developed, in alpha range,  reactive to eye opening and closure.  There was no evidence of epileptiform discharge.There was occasionally 3 sharp transient  Photic stimulation was performed, which induced a symmetric photic driving.  Hyperventilation was performed, there was no abnormality elicit.  No sleep was achieved.  CONCLUSION: This is a normal  awake EEG.  There is no electrodiagnostic evidence of epileptiform discharge.  Marcial Pacas, M.D. Ph.D.  [facility] [place] [address],  [address] Phone: [phone] Fax:      [phone]

## 2016-01-18 NOTE — Telephone Encounter (Signed)
Patient is calling. Se did go to the ER yesterday and was given a Rx for nausea and went back home. Today she is worse and wants to see Dr. Krista Blue today. She thinks all of her problems are coming from the divalproex (DEPAKOTE ER) 500 MG 24 hr tablet. Please call and advise.

## 2016-01-18 NOTE — Patient Instructions (Addendum)
Stop nortriptyline every night Stop Antivert= meclizine daily  Keep lamotrigine 200 mg twice a day Keep Depakote ER 500 mg every night

## 2016-01-18 NOTE — Progress Notes (Signed)
PATIENT: Theresa Mullins DOB: [birth year]  Chief Complaint  Patient presents with  . Intracranial Bleed    Reports weakness in her bilateral legs for three days that is causing her increased gait difficulty.  She has been having to use a rolling walker for ambulation.  She would like to review her MRI and EEG.  She was evaluated for her symptoms in the ED on 01/17/16.     HISTORICAL  Theresa Mullins  Is a 45 years old right-handed female, seen in refer by her primary care  Terald Sleeper for evaluation of frequent headaches, confusion spells, fall on August 16th 2017.  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 She also had a history of depression anxiety, has been treated with lamotrigine 200 mg twice a day for few years  Per patient, she was able to go back to work few months after her intracranial bleeding, last time she went to work was July 06 2015, she worked at [employer], around beginning of 2017, she noticed intermittent confusion episode, right eye jumping, clumsiness of right hand, intermittent unbalanced gait. She also reported increased headache, bilateral frontal retro-orbital area, on a daily basis, she denied loss of consciousness, seizure-like activity.  I personally reviewed most recent MRI of the brain in 2015, CAT scan in July 2017, right frontal encephalomalacia in no acute abnormality.  She was also noted to have a small jaw, shot status, complains of snoring, excessive daytime fatigue and sleepiness.  Update January 18 2016: She has been calling office multiple times, complains of bilateral lower extremity weakness,  difficulty walking, presented to emergency room On January 17 2016, at personally reviewed CAT scan on January 17 2016, in comparison with MRI of the brain January 05 2016, right frontal cystic encephalomalacia and gliosis from prior intracerebral hemorrhage, this communicates with the right frontal horn of right lateral ventricle, scattered nonspecific subcortical gliosis, no significant change compared to previous MRI in 2015,  She is a poor historian, complains feeling generalized weakness, dizziness, vertigo, has been taking meclizine 45 tablets every day, also on chronic lamotrigine 200 mg twice a day for mood disorder, I have added on Depakote ER 500 mg every night since previous visit on December 07 2015, reviewed laboratory evaluation January 17 2016, Depakote level 35, normal CBC, CMP showed low sodium 131, potassium 3.4, chloride 94, creatinine 1.07, A1c 5.3  EEG showed no significant abnormality  REVIEW OF SYSTEMS: Full 14 system review of systems performed and notable only for as above  ALLERGIES: Allergies  Allergen Reactions  . Ciprofloxacin Anaphylaxis  . Remeron [Mirtazapine]     Elevated blood pressure  . Contrast Media [Iodinated Diagnostic Agents] Rash    HOME MEDICATIONS: Current Outpatient Prescriptions  Medication Sig Dispense Refill  . acetaminophen (TYLENOL) 500 MG tablet Take 1,500 mg by mouth daily as needed for headache.    . butalbital-acetaminophen-caffeine (FIORICET, ESGIC) 50-325-40 MG tablet Take 1 tablet by mouth every 6 (six) hours as needed for headache. 10 tablet 3  . diazepam (VALIUM) 10 MG tablet Take 10 mg by mouth every 8 (eight) hours as needed for anxiety.    . divalproex (DEPAKOTE ER) 500 MG 24 hr  tablet Take 1 tablet (500 mg total) by mouth at bedtime. 30 tablet 11  . doxycycline (VIBRA-TABS) 100 MG tablet TAKE ONE (1) CAPSULE EACH DAY 30 tablet 0  . ferrous sulfate 325 (65 FE) MG tablet Take 325 mg by mouth at bedtime.    . furosemide  (LASIX) 20 MG tablet Take 20 mg by mouth every evening.     Marland Kitchen gemfibrozil (LOPID) 600 MG tablet Take 600 mg by mouth at bedtime.     Marland Kitchen HYDROcodone-acetaminophen (NORCO) 10-325 MG tablet Take 1 tablet by mouth every 6 (six) hours as needed for moderate pain or severe pain.   0  . ibuprofen (ADVIL,MOTRIN) 200 MG tablet Take 200 mg by mouth every 6 (six) hours as needed for mild pain or moderate pain.    Marland Kitchen lamoTRIgine (LAMICTAL) 200 MG tablet Take 200 mg by mouth 2 (two) times daily. For mood    . lisinopril (PRINIVIL,ZESTRIL) 10 MG tablet Take 10 mg by mouth daily.    . Multiple Vitamin (MULTIVITAMIN) tablet Take 1 tablet by mouth 2 (two) times daily.     . nortriptyline (PAMELOR) 25 MG capsule Take 25-50 mg by mouth 2 (two) times daily. Patient takes 2 capsules in the morning and 1 capsule at night    . ondansetron (ZOFRAN ODT) 4 MG disintegrating tablet Take 1 tablet (4 mg total) by mouth every 8 (eight) hours as needed for nausea or vomiting. 20 tablet 0  . sertraline (ZOLOFT) 50 MG tablet Take 50 mg by mouth at bedtime.      No current facility-administered medications for this visit.     PAST MEDICAL HISTORY: Past Medical History:  Diagnosis Date  . ADD (attention deficit disorder)   . Anxiety   . Anxiety   . Carpal tunnel syndrome   . Cerebellar bleed ([place])   . Depression   . DUB (dysfunctional uterine bleeding)   . Dysmenorrhea   . High cholesterol   . Hip pain   . Hypertension   . Joint pain   . Mood disorder ([place])   . Restless legs   . Vertigo     PAST SURGICAL HISTORY: Past Surgical History:  Procedure Laterality Date  . BRAIN SURGERY      FAMILY HISTORY: Family History  Problem Relation Age of Onset  . Mental illness Mother   . Hypertension Mother   . Depression Mother   . CAD Mother   . Diabetes Mother   . Melanoma Mother   . Mental illness Father   . Heart attack Father   . Aneurysm Father     SOCIAL HISTORY:  Social History   Social History  .  Marital status: Single    Spouse name: N/A  . Number of children: 0  . Years of education: GED   Occupational History  . Disabled    Social History Main Topics  . Smoking status: Current Every Day Smoker    Packs/day: 0.50    Types: Cigarettes  . Smokeless tobacco: Never Used     Comment: last cigarette used 10/27/15  . Alcohol use No  . Drug use: No  . Sexual activity: Not on file   Other Topics Concern  . Not on file   Social History Narrative   Lives at home with her boyfriend.   Right-handed.   No caffeine use.     PHYSICAL EXAM   Vitals:   01/18/16 1520  BP: 120/87  Pulse: (!) 101  Weight: 146 lb (66.2  kg)  Height: 4\' 7"  (1.397 m)    Not recorded      Body mass index is 33.93 kg/m.  PHYSICAL EXAMNIATION:  Gen: NAD, conversant, well nourised, obese, well groomed                     Cardiovascular: Regular rate rhythm, no peripheral edema, warm, nontender. Eyes: Conjunctivae clear without exudates or hemorrhage Neck: Supple, no carotid bruise. Pulmonary: Clear to auscultation bilaterally   NEUROLOGICAL EXAM:  MENTAL STATUS: Speech:    Speech is normal; fluent and spontaneous with normal comprehension.  Cognition:     Orientation to time, place and person     Normal recent and remote memory     Normal Attention span and concentration     Normal Language, naming, repeating,spontaneous speech     Fund of knowledge   CRANIAL NERVES: CN II: Visual fields are full to confrontation. Fundoscopic exam is normal with sharp discs and no vascular changes. Pupils are round equal and briskly reactive to light. CN III, IV, VI: extraocular movement are normal. No ptosis. CN V: Facial sensation is intact to pinprick in all 3 divisions bilaterally. Corneal responses are intact.  CN VII: Face is symmetric with normal eye closure and smile. CN VIII: Hearing is normal to rubbing fingers CN IX, X: Palate elevates symmetrically. Phonation is normal. CN XI: Head  turning and shoulder shrug are intact CN XII: Tongue is midline with normal movements and no atrophy.  MOTOR: There is no pronator drift of out-stretched arms. Muscle bulk and tone are normal. Muscle strength is normal.  REFLEXES: Reflexes are 2+ and symmetric at the biceps, triceps, knees, and ankles. Plantar responses are flexor.  SENSORY: Intact to light touch, pinprick, positional sensation and vibratory sensation are intact in fingers and toes.  COORDINATION: Rapid alternating movements and fine finger movements are intact. There is no dysmetria on finger-to-nose and heel-knee-shin.    GAIT/STANCE: Deliberate effort, was able to walk holding her walker  DIAGNOSTIC DATA (LABS, IMAGING, TESTING) - I reviewed patient records, labs, notes, testing and imaging myself where available.   ASSESSMENT AND PLAN  Maryna Tout is a 45 y.o. female   History of right frontal lobe bleeding, right frontal encephalomalacia,  Recurrent episode of confusion most suggestive of complex partial seizure,Versus medicine side effect, versus anxiety attack  She is already on lamotrigine 200 mg twice a day for her mood disorder, continue  Depakote ER 500 mg at nighttime  New onset dizziness,  Possible polypharmacy side effect, I have advised her stop daily meclizine use, check lamotrigine level   Chronic migraine headaches  Fioricet as needed   Snoring, excessive daytime fatigue and sleepiness,  She has short status, narrow oropharyngeal, is at high risk for develop obstructive sleep apnea  May consider sleep referring  Marcial Pacas, M.D. Ph.D.  [facility] [address] [place], [place] [address] Ph: [phone] Fax: [phone]  CC: Terald Sleeper, PA

## 2016-01-19 ENCOUNTER — Telehealth: Payer: Self-pay | Admitting: Neurology

## 2016-01-19 NOTE — Telephone Encounter (Signed)
Pt called in wanting to speak with the nurse. She stated she wants to apologize for her behavior yesterday. Please call [phone]

## 2016-01-19 NOTE — Telephone Encounter (Signed)
Returned call to patient - had to leave a message to please not worry about her appointment yesterday.  We understand that she was not feeling well.  No apology necessary.

## 2016-01-20 ENCOUNTER — Other Ambulatory Visit: Payer: Self-pay | Admitting: *Deleted

## 2016-01-20 LAB — LAMOTRIGINE LEVEL: LAMOTRIGINE LVL: 12.5 ug/mL (ref 2.0–20.0)

## 2016-01-24 ENCOUNTER — Emergency Department (HOSPITAL_COMMUNITY)
Admission: EM | Admit: 2016-01-24 | Discharge: 2016-01-24 | Disposition: A | Discharge status: Home / Self Care | Payer: BLUE CROSS/BLUE SHIELD | Attending: Emergency Medicine | Admitting: Emergency Medicine

## 2016-01-24 ENCOUNTER — Emergency Department (HOSPITAL_COMMUNITY): Payer: BLUE CROSS/BLUE SHIELD

## 2016-01-24 ENCOUNTER — Encounter (HOSPITAL_COMMUNITY): Payer: Self-pay | Admitting: Emergency Medicine

## 2016-01-24 DIAGNOSIS — F909 Attention-deficit hyperactivity disorder, unspecified type: Secondary | ICD-10-CM | POA: Diagnosis not present

## 2016-01-24 DIAGNOSIS — I10 Essential (primary) hypertension: Secondary | ICD-10-CM | POA: Insufficient documentation

## 2016-01-24 DIAGNOSIS — Z87891 Personal history of nicotine dependence: Secondary | ICD-10-CM | POA: Diagnosis not present

## 2016-01-24 DIAGNOSIS — R531 Weakness: Secondary | ICD-10-CM | POA: Diagnosis present

## 2016-01-24 DIAGNOSIS — Z79899 Other long term (current) drug therapy: Secondary | ICD-10-CM | POA: Insufficient documentation

## 2016-01-24 DIAGNOSIS — R4182 Altered mental status, unspecified: Secondary | ICD-10-CM | POA: Diagnosis not present

## 2016-01-24 LAB — URINALYSIS, ROUTINE W REFLEX MICROSCOPIC
Bilirubin Urine: NEGATIVE
Glucose, UA: NEGATIVE mg/dL
Hgb urine dipstick: NEGATIVE
Ketones, ur: NEGATIVE mg/dL
Nitrite: NEGATIVE
Protein, ur: NEGATIVE mg/dL
Specific Gravity, Urine: 1.014 (ref 1.005–1.030)
pH: 7.5 (ref 5.0–8.0)

## 2016-01-24 LAB — VALPROIC ACID LEVEL: Valproic Acid Lvl: 49 ug/mL — ABNORMAL LOW (ref 50.0–100.0)

## 2016-01-24 LAB — CBC WITH DIFFERENTIAL/PLATELET
Basophils Absolute: 0 10*3/uL (ref 0.0–0.1)
Basophils Relative: 1 %
Eosinophils Absolute: 0.1 10*3/uL (ref 0.0–0.7)
Eosinophils Relative: 1 %
HCT: 41.6 % (ref 36.0–46.0)
Hemoglobin: 13.7 g/dL (ref 12.0–15.0)
Lymphocytes Relative: 35 %
Lymphs Abs: 2.4 10*3/uL (ref 0.7–4.0)
MCH: 32.6 pg (ref 26.0–34.0)
MCHC: 32.9 g/dL (ref 30.0–36.0)
MCV: 99 fL (ref 78.0–100.0)
Monocytes Absolute: 0.5 10*3/uL (ref 0.1–1.0)
Monocytes Relative: 7 %
Neutro Abs: 3.9 10*3/uL (ref 1.7–7.7)
Neutrophils Relative %: 56 %
Platelets: 430 10*3/uL — ABNORMAL HIGH (ref 150–400)
RBC: 4.2 MIL/uL (ref 3.87–5.11)
RDW: 12.1 % (ref 11.5–15.5)
WBC: 6.9 10*3/uL (ref 4.0–10.5)

## 2016-01-24 LAB — URINE MICROSCOPIC-ADD ON: RBC / HPF: NONE SEEN RBC/hpf (ref 0–5)

## 2016-01-24 LAB — BASIC METABOLIC PANEL
Anion gap: 11 (ref 5–15)
BUN: 9 mg/dL (ref 6–20)
CO2: 23 mmol/L (ref 22–32)
Calcium: 9.8 mg/dL (ref 8.9–10.3)
Chloride: 103 mmol/L (ref 101–111)
Creatinine, Ser: 0.73 mg/dL (ref 0.44–1.00)
GFR calc Af Amer: >60 mL/min (ref >60)
GFR calc non Af Amer: >60 mL/min (ref >60)
Glucose, Bld: 67 mg/dL (ref 65–99)
Potassium: 4 mmol/L (ref 3.5–5.1)
Sodium: 137 mmol/L (ref 135–145)

## 2016-01-24 LAB — I-STAT BETA HCG BLOOD, ED (MC, WL, AP ONLY): I-stat hCG, quantitative: <5 m[IU]/mL (ref <5)

## 2016-01-24 MED ORDER — SODIUM CHLORIDE 0.9 % IV BOLUS (SEPSIS)
1000.0000 mL | Freq: Once | INTRAVENOUS | Status: AC
  Administered 2016-01-24: 1000 mL via INTRAVENOUS

## 2016-01-24 NOTE — Discharge Instructions (Signed)
Return here as needed.  You need to see her neurologist as soon as possible

## 2016-01-24 NOTE — ED Triage Notes (Signed)
Sister reports that pt has had slurred speech and confusion since yesterday. Pt usually walks with a walker, unable stand today without assist. Boyfriend/roomate stated that she fell yesterday. Pt stated that she struck her head. Sister states that neurogist is currently adjusting medication due to recurrent confusion. Pt is currently appropriate, answers questions correctly, with slurred speech. Dr. Krista Blue(?) is neurologist.

## 2016-01-24 NOTE — ED Provider Notes (Signed)
[facility] DEPT Provider Note   CSN: [id number] Arrival date & time: 01/24/16  1406     History   Chief Complaint Chief Complaint  Patient presents with  . Weakness    since yesterday, difficulty walking  . Headache  . Aphasia    24 hour c/o slurred speech  . Fall    struck forehead yesterday  . Altered Mental Status    change per family    HPI Theresa Mullins is a 45 y.o. female.  HPI Patient presents to the emergency department with generalized weakness, difficulty walking, confusion over the last few days.  The patient states that she was recently seen for similar symptoms at [facility].  She is also followed up closely by her neurologist for similar symptoms.  She states yesterday she fell due to the fact she has a hard time walking.  The patient had a recent MRI and CT scan.  She has had a recent EEG which was normal. The patient denies chest pain, shortness of breath, headache,blurred vision, neck pain, fever, cough,  numbness, anorexia, edema, abdominal pain, nausea, vomiting, diarrhea, rash, back pain, dysuria, hematemesis, bloody stool, near syncope, or syncope.  Patient states nothing seems to make the condition better or worse Past Medical History:  Diagnosis Date  . ADD (attention deficit disorder)   . Anxiety   . Anxiety   . Carpal tunnel syndrome   . Cerebellar bleed ([place])   . Depression   . DUB (dysfunctional uterine bleeding)   . Dysmenorrhea   . High cholesterol   . Hip pain   . Hypertension   . Joint pain   . Mood disorder ([place])   . Restless legs   . Vertigo     Patient Active Problem List   Diagnosis Date Noted  . Abnormality of gait 01/18/2016  . Vertigo 01/18/2016  . Colitis 03/20/2012  . Rectal bleeding 03/20/2012  . Hypokalemia 03/20/2012  . Hypertension 03/20/2012  . Anxiety 03/20/2012    Past Surgical History:  Procedure Laterality Date  . BRAIN SURGERY      OB History    No data available       Home Medications     Prior to Admission medications   Medication Sig Start Date End Date Taking? Authorizing Provider  ALPRAZolam Duanne Moron) 1 MG tablet Take 1 mg by mouth 4 (four) times daily.   Yes Historical Provider, MD  butalbital-acetaminophen-caffeine (FIORICET, ESGIC) 50-325-40 MG tablet Take 1 tablet by mouth every 6 (six) hours as needed for headache. 12/07/15  Yes Marcial Pacas, MD  diazepam (VALIUM) 10 MG tablet Take 5-10 mg by mouth every 8 (eight) hours as needed for anxiety.    Yes Historical Provider, MD  divalproex (DEPAKOTE ER) 500 MG 24 hr tablet Take 1 tablet (500 mg total) by mouth at bedtime. 12/07/15  Yes Marcial Pacas, MD  doxycycline (VIBRA-TABS) 100 MG tablet TAKE ONE (1) CAPSULE EACH DAY 12/22/15  Yes Terald Sleeper, PA  furosemide (LASIX) 20 MG tablet Take 20 mg by mouth every evening.    Yes Historical Provider, MD  gemfibrozil (LOPID) 600 MG tablet Take 600 mg by mouth 2 (two) times daily.    Yes Historical Provider, MD  HYDROcodone-acetaminophen (NORCO) 10-325 MG tablet Take 1 tablet by mouth every 8 (eight) hours as needed for moderate pain or severe pain.  12/22/15  Yes Historical Provider, MD  ibuprofen (ADVIL,MOTRIN) 800 MG tablet Take 800 mg by mouth 3 (three) times daily.  Yes Historical Provider, MD  lamoTRIgine (LAMICTAL) 200 MG tablet Take 200 mg by mouth 2 (two) times daily. For mood   Yes Historical Provider, MD  lisinopril (PRINIVIL,ZESTRIL) 10 MG tablet Take 10 mg by mouth daily.   Yes Historical Provider, MD  meclizine (ANTIVERT) 25 MG tablet Take 25 mg by mouth 4 (four) times daily.   Yes Historical Provider, MD  methylphenidate (RITALIN) 10 MG tablet Take 10 mg by mouth 2 (two) times daily.   Yes Historical Provider, MD  nortriptyline (PAMELOR) 25 MG capsule Take 25 mg by mouth 3 (three) times daily.   Yes Historical Provider, MD  ondansetron (ZOFRAN ODT) 4 MG disintegrating tablet Take 1 tablet (4 mg total) by mouth every 8 (eight) hours as needed for nausea or vomiting. 01/17/16  Yes  Isla Pence, MD  sertraline (ZOLOFT) 50 MG tablet Take 50 mg by mouth 2 (two) times daily.    Yes Historical Provider, MD  acetaminophen (TYLENOL) 500 MG tablet Take 1,500 mg by mouth daily as needed for headache.    Historical Provider, MD  ferrous sulfate 325 (65 FE) MG tablet Take 325 mg by mouth at bedtime.    Historical Provider, MD  ibuprofen (ADVIL,MOTRIN) 200 MG tablet Take 200 mg by mouth every 6 (six) hours as needed for mild pain or moderate pain.    Historical Provider, MD  Multiple Vitamin (MULTIVITAMIN) tablet Take 1 tablet by mouth 2 (two) times daily.     Historical Provider, MD    Family History Family History  Problem Relation Age of Onset  . Mental illness Mother   . Hypertension Mother   . Depression Mother   . CAD Mother   . Diabetes Mother   . Melanoma Mother   . Mental illness Father   . Heart attack Father   . Aneurysm Father     Social History Social History  Substance Use Topics  . Smoking status: Former Smoker    Packs/day: 0.50    Quit date: 10/22/2015  . Smokeless tobacco: Never Used     Comment: last cigarette used 10/27/15  . Alcohol use No     Allergies   Ciprofloxacin; Remeron [mirtazapine]; and Contrast media [iodinated diagnostic agents]   Review of Systems Review of Systems All other systems negative except as documented in the HPI. All pertinent positives and negatives as reviewed in the HPI.  Physical Exam Updated Vital Signs BP (!) 120/101 (BP Location: Right Arm)   Pulse 87   Temp 98 F (36.7 C) (Oral)   Resp 17   LMP 12/22/2015 (Approximate) Comment: pt stated possible menopausal neg preg test 10.03.2017  SpO2 100%   Physical Exam  Constitutional: She is oriented to person, place, and time. She appears well-developed and well-nourished. No distress.  HENT:  Head: Normocephalic and atraumatic.  Mouth/Throat: Oropharynx is clear and moist.  Eyes: Pupils are equal, round, and reactive to light.  Neck: Normal range of  motion. Neck supple.  Cardiovascular: Normal rate, regular rhythm and normal heart sounds.  Exam reveals no gallop and no friction rub.   No murmur heard. Pulmonary/Chest: Effort normal and breath sounds normal. No respiratory distress. She has no wheezes.  Abdominal: Soft. Bowel sounds are normal. She exhibits no distension. There is no tenderness.  Neurological: She is alert and oriented to person, place, and time. She has normal strength. No sensory deficit. She exhibits normal muscle tone. Coordination normal. GCS eye subscore is 4. GCS verbal subscore is 5. GCS motor  subscore is 6.  Skin: Skin is warm and dry. No rash noted. No erythema.  Psychiatric: She has a normal mood and affect. Her behavior is normal.  Nursing note and vitals reviewed.    ED Treatments / Results  Labs (all labs ordered are listed, but only abnormal results are displayed) Labs Reviewed  CBC WITH DIFFERENTIAL/PLATELET - Abnormal; Notable for the following:       Result Value   Platelets 430 (*)    All other components within normal limits  URINALYSIS, ROUTINE W REFLEX MICROSCOPIC (NOT AT [facility]) - Abnormal; Notable for the following:    APPearance CLOUDY (*)    Leukocytes, UA SMALL (*)    All other components within normal limits  VALPROIC ACID LEVEL - Abnormal; Notable for the following:    Valproic Acid Lvl 49 (*)    All other components within normal limits  URINE MICROSCOPIC-ADD ON - Abnormal; Notable for the following:    Squamous Epithelial / LPF 6-30 (*)    Bacteria, UA FEW (*)    All other components within normal limits  BASIC METABOLIC PANEL  I-STAT BETA HCG BLOOD, ED ([facility], [facility], [facility] ONLY)    EKG  EKG Interpretation None       Radiology Dg Lumbar Spine Complete  Result Date: 01/24/2016 CLINICAL DATA:  Fall today.  Lower back pain. EXAM: LUMBAR SPINE - COMPLETE 4+ VIEW COMPARISON:  CT 03/20/2012 FINDINGS: Normal alignment of the lumbar spine. Disc space loss along the anterior aspect of  L3-L4. Vertebral body heights are maintained. Mild degenerative endplate changes in lower thoracic spine. No pars defect. IMPRESSION: No acute abnormality. Disc space disease at L3-L4. Electronically Signed   By: Markus Daft M.D.   On: 01/24/2016 18:55   Ct Head Wo Contrast  Result Date: 01/24/2016 CLINICAL DATA:  Slurred speech, confusion since yesterday EXAM: CT HEAD WITHOUT CONTRAST TECHNIQUE: Contiguous axial images were obtained from the base of the skull through the vertex without intravenous contrast. COMPARISON:  01/17/2016 FINDINGS: Brain: No evidence of acute infarction, hemorrhage, hydrocephalus, extra-axial collection or mass lesion/mass effect. 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 Vascular: No hyperdense vessel or unexpected calcification. Skull: Prior right frontal craniotomy. Negative for fracture or focal lesion. Sinuses/Orbits: No acute finding. Other: None. IMPRESSION: No acute intracranial pathology. Electronically Signed   By: Kathreen Devoid   On: 01/24/2016 16:53    Procedures Procedures (including critical care time)  Medications Ordered in ED Medications  sodium chloride 0.9 % bolus 1,000 mL (0 mLs Intravenous Stopped 01/24/16 1924)     Initial Impression / Assessment and Plan / ED Course  I have reviewed the triage vital signs and the nursing notes.  Pertinent labs & imaging results that were available during my care of the patient were reviewed by me and considered in my medical decision making (see chart for details).  Clinical Course    Upon reviewing the patient's chart.  The patient was seen recently in the emergency department several times for similar symptoms.  She was also seen by her neurologist and were several visits in the last 2 months for similar symptoms.  She has had multiple imaging studies, along with an EEG.  The patient does not have any significant neurological deficits noted on exam.  The patient, based on her previous notes appears  to be at her baseline functionality patient is advised follow-up with her neurologist as soon as possible.  Told to return here as needed  Final Clinical Impressions(s) /  ED Diagnoses   Final diagnoses:  None    New Prescriptions New Prescriptions   No medications on file     Dalia Heading, PA-C 01/25/16 0154

## 2016-01-24 NOTE — ED Provider Notes (Signed)
Medical screening examination/treatment/procedure(s) were performed by non-physician practitioner and as supervising physician I was immediately available for consultation/collaboration.   EKG Interpretation None     45 year old female here complaining of increasing weakness which is been a chronic issue for her. Is followed by neurology and there is no definitive diagnosis at this point. Symptoms x-rays and labs reviewed today it without significant findings. Patient feels at her baseline and will follow-up with her neurologist   Lacretia Leigh, MD 01/24/16 1956

## 2016-01-25 ENCOUNTER — Ambulatory Visit: Payer: BLUE CROSS/BLUE SHIELD | Admitting: Neurology

## 2016-01-26 LAB — URINE CULTURE: Culture: >=100000 — AB

## 2016-01-27 ENCOUNTER — Telehealth (HOSPITAL_BASED_OUTPATIENT_CLINIC_OR_DEPARTMENT_OTHER): Payer: Self-pay

## 2016-01-27 NOTE — Telephone Encounter (Signed)
Post ED Visit - Positive Culture Follow-up  Culture report reviewed by antimicrobial stewardship pharmacist:  []  Elenor Quinones, Pharm.D. []  Heide Guile, Pharm.D., BCPS []  Parks Neptune, Pharm.D. []  Alycia Rossetti, Pharm.D., BCPS []  [place], Pharm.D., BCPS, AAHIVP []  Legrand Como, Pharm.D., BCPS, AAHIVP []  Milus Glazier, Pharm.D. []  Stephens November, [place].D. Renee Ackley Pharm D Positive urine culture and no further patient follow-up is required at this time.  Genia Del 01/27/2016, 9:35 AM

## 2016-02-20 ENCOUNTER — Ambulatory Visit: Payer: BLUE CROSS/BLUE SHIELD | Admitting: Nurse Practitioner

## 2016-03-17 ENCOUNTER — Other Ambulatory Visit: Payer: Self-pay | Admitting: Neurology

## 2016-04-17 ENCOUNTER — Other Ambulatory Visit: Payer: Self-pay | Admitting: Physician Assistant

## 2016-05-15 ENCOUNTER — Other Ambulatory Visit: Payer: Self-pay | Admitting: Physician Assistant

## 2016-05-16 ENCOUNTER — Other Ambulatory Visit: Payer: Self-pay | Admitting: Physician Assistant

## 2016-06-07 DIAGNOSIS — Z0289 Encounter for other administrative examinations: Secondary | ICD-10-CM

## 2016-08-29 ENCOUNTER — Ambulatory Visit: Payer: BLUE CROSS/BLUE SHIELD | Admitting: Nurse Practitioner

## 2016-09-20 ENCOUNTER — Ambulatory Visit (INDEPENDENT_AMBULATORY_CARE_PROVIDER_SITE_OTHER): Payer: BLUE CROSS/BLUE SHIELD

## 2016-09-20 ENCOUNTER — Ambulatory Visit (INDEPENDENT_AMBULATORY_CARE_PROVIDER_SITE_OTHER): Payer: BLUE CROSS/BLUE SHIELD | Admitting: Orthopaedic Surgery

## 2016-09-20 ENCOUNTER — Encounter (INDEPENDENT_AMBULATORY_CARE_PROVIDER_SITE_OTHER): Payer: Self-pay | Admitting: Orthopaedic Surgery

## 2016-09-20 VITALS — BP 113/73 | HR 100 | Ht <= 58 in | Wt 142.0 lb

## 2016-09-20 DIAGNOSIS — G8929 Other chronic pain: Secondary | ICD-10-CM | POA: Diagnosis not present

## 2016-09-20 DIAGNOSIS — M25551 Pain in right hip: Secondary | ICD-10-CM | POA: Diagnosis not present

## 2016-09-20 DIAGNOSIS — M545 Low back pain: Secondary | ICD-10-CM | POA: Diagnosis not present

## 2016-09-20 NOTE — Addendum Note (Signed)
Addended by: Meyer Cory on: 09/20/2016 04:46 PM   Modules accepted: Orders

## 2016-09-20 NOTE — Progress Notes (Signed)
Office Visit Note   Patient: Theresa Mullins           Date of Birth: [birth year]           MRN: [id number] Visit Date: 09/20/2016              Requested by: Octavio Graves, [place] [address] [place], [place] [address] PCP: Octavio Graves, DO   Assessment & Plan: Visit Diagnoses:  1. Right hip pain     Plan: Patient sent pain for greater than 8 months. She's been on several different medications currently taking hydrocodone 10/325. I recommend obtaining an MRI scan with her past history questionable brain lesion and past workup for malignancy. She may have some lateral recess narrowing at L3-4 on the right. I'll follow up after scan.  Follow-Up Instructions: No Follow-up on file.   Orders:  Orders Placed This Encounter  Procedures  . XR HIP UNILAT W OR W/O PELVIS 2-3 VIEWS RIGHT   No orders of the defined types were placed in this encounter.     Procedures: No procedures performed   Clinical Data: No additional findings.   Subjective: Chief Complaint  Patient presents with  . Lower Back - Pain  . Right Hip - Pain    HPI 46 year old female with more than 8 month history  with back pain that radiates from the right buttocks into the right groin with the sharp catching type sensation. She's had pain for greater than 8 months. She's taken under cut and used heat ice anti-inflammatories without relief. Past the history of craniotomy with evacuation of epidural bleed. He relates increasing problems with trying to work with increasing pain. She worked multiple jobs more recently has had increasing problems doing any job. Previous lumbar x-rays obtained few months ago showed this the space narrowing at L3-4. Patient been on hydrocodone 10/325 for the pain as well as clonazepam. 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 History of multiple falls. Right hip pain,  vertigo, depression, mood disorder.   Objective: Vital Signs: BP 113/73   Pulse 100   Ht 4\' 7"  (1.397 m)   Wt 142 lb (64.4 kg)   BMI 33.00 kg/m   Physical Exam  Constitutional: She is oriented to person, place, and time. She appears well-developed.  HENT:  Head: Normocephalic.  Right Ear: External ear normal.  Left Ear: External ear normal.  Eyes: Pupils are equal, round, and reactive to light.  Neck: No tracheal deviation present. No thyromegaly present.  Cardiovascular: Normal rate.   Pulmonary/Chest: Effort normal.  Abdominal: Soft.  Musculoskeletal:  Negative Fabere. Heel toe gait is normal with hip internal/external rotation. No hip flexion contracture. No tenderness of the trochanter. She's tender over the right SI joint. Renal percussion is nontender. No increased pain with straight leg raising negative popliteal compression test anterior tib EHL is strong. Distal pulses are intact.  Neurological: She is alert and oriented to person, place, and time.  Skin: Skin is warm and dry.  Psychiatric:  Patient pleasant she's somewhat intermittent tearful concerning the ongoing problems she's having. She relates she's had some much trouble she's been told that the she needs to be on disability.    Ortho Exam  Specialty Comments:  No specialty comments available.  Imaging: Xr Hip Unilat W Or W/o Pelvis 2-3 Views Right  Result Date: 09/20/2016 AP pelvis frog leg right hip  are obtained and reviewed. This shows normal barn bony architecture. There is some endplate spurring at Q7-5 and L3-4. No hip osteoarthritis SI joints are normal normal pelvis anatomy. Impression normal pelvis and right hip x-rays. Lower lumbar degenerative changes noted.    PMFS History: Patient Active Problem List   Diagnosis Date Noted  . Abnormality of gait 01/18/2016  . Vertigo 01/18/2016  . Colitis 03/20/2012  . Rectal bleeding 03/20/2012  . Hypokalemia 03/20/2012  . Hypertension 03/20/2012  .  Anxiety 03/20/2012   Past Medical History:  Diagnosis Date  . ADD (attention deficit disorder)   . Anxiety   . Anxiety   . Carpal tunnel syndrome   . Cerebellar bleed ([place])   . Depression   . DUB (dysfunctional uterine bleeding)   . Dysmenorrhea   . High cholesterol   . Hip pain   . Hypertension   . Joint pain   . Mood disorder ([place])   . Restless legs   . Vertigo     Family History  Problem Relation Age of Onset  . Mental illness Mother   . Hypertension Mother   . Depression Mother   . CAD Mother   . Diabetes Mother   . Melanoma Mother   . Mental illness Father   . Heart attack Father   . Aneurysm Father     Past Surgical History:  Procedure Laterality Date  . BRAIN SURGERY     Social History   Occupational History  . Disabled    Social History Main Topics  . Smoking status: Former Smoker    Packs/day: 0.50    Quit date: 10/22/2015  . Smokeless tobacco: Never Used     Comment: last cigarette used 10/27/15  . Alcohol use No  . Drug use: No  . Sexual activity: Not on file

## 2016-10-01 ENCOUNTER — Ambulatory Visit: Payer: BLUE CROSS/BLUE SHIELD | Admitting: Nurse Practitioner

## 2016-10-18 ENCOUNTER — Ambulatory Visit (INDEPENDENT_AMBULATORY_CARE_PROVIDER_SITE_OTHER): Payer: BLUE CROSS/BLUE SHIELD | Admitting: Orthopaedic Surgery

## 2016-11-08 ENCOUNTER — Ambulatory Visit (INDEPENDENT_AMBULATORY_CARE_PROVIDER_SITE_OTHER): Payer: BLUE CROSS/BLUE SHIELD | Admitting: Orthopaedic Surgery

## 2016-11-12 ENCOUNTER — Other Ambulatory Visit: Payer: Self-pay | Admitting: Physician Assistant

## 2016-12-03 ENCOUNTER — Encounter: Payer: Self-pay | Admitting: Orthopaedic Surgery

## 2016-12-06 ENCOUNTER — Encounter (INDEPENDENT_AMBULATORY_CARE_PROVIDER_SITE_OTHER): Payer: Self-pay | Admitting: Orthopaedic Surgery

## 2016-12-06 ENCOUNTER — Ambulatory Visit (INDEPENDENT_AMBULATORY_CARE_PROVIDER_SITE_OTHER): Payer: BLUE CROSS/BLUE SHIELD | Admitting: Orthopaedic Surgery

## 2016-12-06 VITALS — BP 139/97 | HR 103

## 2016-12-06 DIAGNOSIS — G8929 Other chronic pain: Secondary | ICD-10-CM | POA: Diagnosis not present

## 2016-12-06 DIAGNOSIS — M545 Low back pain: Secondary | ICD-10-CM

## 2016-12-06 NOTE — Progress Notes (Signed)
Office Visit Note   Patient: Theresa Mullins           Date of Birth: 03-Dec-1970           MRN: [id number] Visit Date: 12/06/2016              Requested by: Octavio Graves, [place] [address] [place], [place] [address] PCP: Octavio Graves, DO   Assessment & Plan: Visit Diagnoses:  1. Chronic bilateral low back pain without sciatica     Plan: We will set her up for a single epidural injection. She's had chronic problems with hand numbness and carpal tunnel injections also electrical test done in the past which she states was positive for carpal tunnel which was severe on the right worse than on the left.  Follow-Up Instructions: Return in about 3 months (around 03/08/2017).   Orders:  No orders of the defined types were placed in this encounter.  No orders of the defined types were placed in this encounter.     Procedures: No procedures performed   Clinical Data: No additional findings.   Subjective: Chief Complaint  Patient presents with  . Lower Back - Pain, Follow-up    HPI patient returns with ongoing problems with back pain. MRI scan is available. MRI dated 12/03/2016 shows soft disc protrusions at L3-4 and L4-5 with some endplate changes. No neural impingement no significant spinal or foraminal stenosis. Mild facet degenerative changes at both levels. Patient had ongoing problems with carpal tunnel syndrome much worse on the right than left.  Review of Systems review of systems updated 14 point and is unchanged from 09/20/2016 office visit.   Objective: Vital Signs: BP (!) 139/97   Pulse (!) 103   Physical Exam  Constitutional: She is oriented to person, place, and time. She appears well-developed.  HENT:  Head: Normocephalic.  Right Ear: External ear normal.  Left Ear: External ear normal.  Eyes: Pupils are equal, round, and reactive to light.  Neck: No tracheal deviation present. No thyromegaly present.  Cardiovascular: Normal rate.   Pulmonary/Chest:  Effort normal.  Abdominal: Soft.  Musculoskeletal:  Patient has pain with carpal compression on the right positive Phalen's. No thenar atrophy. She has some pain with straight leg raising. There is some the calluses over her knees for she's had previous falls. No bleeding or abrasions noted today. Knee straight leg raising 90 normal heel toe gait. Anterior tib EHL is strong.  Neurological: She is alert and oriented to person, place, and time.  Skin: Skin is warm and dry.  Psychiatric: She has a normal mood and affect. Her behavior is normal.    Ortho Exam  Specialty Comments:  No specialty comments available.  Imaging:   PMFS History: Patient Active Problem List   Diagnosis Date Noted  . Abnormality of gait 01/18/2016  . Vertigo 01/18/2016  . Colitis 03/20/2012  . Rectal bleeding 03/20/2012  . Hypokalemia 03/20/2012  . Hypertension 03/20/2012  . Anxiety 03/20/2012   Past Medical History:  Diagnosis Date  . ADD (attention deficit disorder)   . Anxiety   . Anxiety   . Carpal tunnel syndrome   . Cerebellar bleed ([place])   . Depression   . DUB (dysfunctional uterine bleeding)   . Dysmenorrhea   . High cholesterol   . Hip pain   . Hypertension   . Joint pain   . Mood disorder ([place])   . Restless legs   . Vertigo     Family  History  Problem Relation Age of Onset  . Mental illness Mother   . Hypertension Mother   . Depression Mother   . CAD Mother   . Diabetes Mother   . Melanoma Mother   . Mental illness Father   . Heart attack Father   . Aneurysm Father     Past Surgical History:  Procedure Laterality Date  . BRAIN SURGERY     Social History   Occupational History  . Disabled    Social History Main Topics  . Smoking status: Former Smoker    Packs/day: 0.50    Quit date: 10/22/2015  . Smokeless tobacco: Never Used     Comment: last cigarette used 10/27/15  . Alcohol use No  . Drug use: No  . Sexual activity: Not on file

## 2017-03-08 ENCOUNTER — Ambulatory Visit (INDEPENDENT_AMBULATORY_CARE_PROVIDER_SITE_OTHER): Payer: BLUE CROSS/BLUE SHIELD | Admitting: Orthopaedic Surgery

## 2017-03-08 ENCOUNTER — Encounter (INDEPENDENT_AMBULATORY_CARE_PROVIDER_SITE_OTHER): Payer: Self-pay | Admitting: Orthopaedic Surgery

## 2017-03-08 VITALS — BP 110/70 | HR 91 | Ht <= 58 in | Wt 148.0 lb

## 2017-03-08 DIAGNOSIS — M47816 Spondylosis without myelopathy or radiculopathy, lumbar region: Secondary | ICD-10-CM | POA: Insufficient documentation

## 2017-03-08 DIAGNOSIS — G5602 Carpal tunnel syndrome, left upper limb: Secondary | ICD-10-CM | POA: Insufficient documentation

## 2017-03-08 DIAGNOSIS — G5601 Carpal tunnel syndrome, right upper limb: Secondary | ICD-10-CM | POA: Insufficient documentation

## 2017-03-08 DIAGNOSIS — M4696 Unspecified inflammatory spondylopathy, lumbar region: Secondary | ICD-10-CM

## 2017-03-08 NOTE — Progress Notes (Addendum)
Office Visit Note   Patient: Theresa Mullins           Date of Birth: 1970-12-18           MRN: [id number] Visit Date: 03/08/2017              Requested by: Octavio Graves, [place] [address] [place], [place] [address]  PCP: Octavio Graves, DO   Assessment & Plan: Visit Diagnoses:  1. Facet arthritis of lumbar region ([place])   2. Carpal tunnel syndrome, left upper limb   3. Carpal tunnel syndrome, right upper limb     Plan: Patient requested repeat electrical test since her hands of gotten worse with the increased carpal tunnel syndrome symptoms.  We will proceed with a single epidural injection with Dr. Ernestina Patches possibly L4-5 facet injections or midline epidural.  She has been through therapy, exercise program, pain medication without relief.  Office follow-up after nerve conduction velocities. We reviewed previous MRI scan that she had done in August.  Reviewed previous CT scans also previous MRI scan of her head.  Wrist splints applied for patient's left hand which she does not have that she can use at night. Follow-Up Instructions: No Follow-up on file.   Orders:  No orders of the defined types were placed in this encounter.  No orders of the defined types were placed in this encounter.     Procedures: No procedures performed   Clinical Data: No additional findings.   Subjective: Chief Complaint  Patient presents with  . Lower Back - Pain, Follow-up    HPI 46 year old female returns a have not seen her since August.  At that time we discussed setting up a single epidural injection to see if we gave her some relief.  She had an MRI scan that showed some facet arthropathy at L4-5 and minimal disc bulge at L2-3 and L3-4 without compression.  She has pain when she gets from sitting to standing.  When she is upright and walking she feels better.  She has problems when she bends or twists or turns.  She has had past history of head bleed and has problems with chronic headaches.  Her  other principal problem is been bilateral hand numbness.  She has had previous nerve conduction velocities that I am unable to view and she states this showed bilateral carpal tunnel syndrome and this was several years ago.  Surgery was discussed with her at that time by some surgeon she does not recall exactly who it was but at the time she could not financially proceed.  She has special gloves she wears at night she also has a splint she is on her right hand.  The hands wake her up at night she has to shake them.  She drops objects and has pain with numbness in the radial 3 fingers both hands.  Currently left hand is worse than right hand.  Review of Systems admission last year for colitis with rectal bleeding.  Anxiety.  Previous history of fall with intracranial bleeding with right frontal encephalomalacia followed by [facility].  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   Objective: Vital Signs: BP 110/70   Pulse 91   Ht 4\' 7"  (1.397 m)   Wt 148 lb (67.1 kg)   BMI 34.40 kg/m   Physical Exam  Constitutional: She is oriented to person, place, and time. She appears well-developed.  HENT:  Head: Normocephalic.  Right Ear:  External ear normal.  Left Ear: External ear normal.  Eyes: Pupils are equal, round, and reactive to light.  Neck: No tracheal deviation present. No thyromegaly present.  Cardiovascular: Normal rate.  Pulmonary/Chest: Effort normal.  Abdominal: Soft.  Neurological: She is alert and oriented to person, place, and time.  Skin: Skin is warm and dry.  Psychiatric: She has a normal mood and affect. Her behavior is normal.  Problems related to previous head trauma and intracranial bleeding.  Some short-term memory problems.    Ortho Exam patient does not have any brachial plexus tenderness right or left.  Upper extremity reflexes are 1+ and symmetrical positive carpal compression test  positive Phalen's test left and right.  Decreased sensation radial 3 and half fingers.  No interossei weakness.  She does not have any thenar atrophy right or left.  No hyperthenar atrophy.  Interossei are strong.  Biceps triceps deltoid supination pronation is normal.  Normal heel toe gait.  Some tenderness with palpation of the lumbar spine.  Negative straight leg raising.  Crepitus with knee extension no pain with hip range of motion negative logroll test.  Anterior tib EHL is intact. Pulses are intact toe flexion extension is intact heel toe walking is normal. Specialty Comments:  No specialty comments available.  Imaging: No results found.   PMFS History: Patient Active Problem List   Diagnosis Date Noted  . Abnormality of gait 01/18/2016  . Vertigo 01/18/2016  . Colitis 03/20/2012  . Rectal bleeding 03/20/2012  . Hypokalemia 03/20/2012  . Hypertension 03/20/2012  . Anxiety 03/20/2012   Past Medical History:  Diagnosis Date  . ADD (attention deficit disorder)   . Anxiety   . Anxiety   . Carpal tunnel syndrome   . Cerebellar bleed ([place])   . Depression   . DUB (dysfunctional uterine bleeding)   . Dysmenorrhea   . High cholesterol   . Hip pain   . Hypertension   . Joint pain   . Mood disorder ([place])   . Restless legs   . Vertigo     Family History  Problem Relation Age of Onset  . Mental illness Mother   . Hypertension Mother   . Depression Mother   . CAD Mother   . Diabetes Mother   . Melanoma Mother   . Mental illness Father   . Heart attack Father   . Aneurysm Father     Past Surgical History:  Procedure Laterality Date  . BRAIN SURGERY     Social History   Occupational History  . Occupation: Disabled  Tobacco Use  . Smoking status: Former Smoker    Packs/day: 0.50    Last attempt to quit: 10/22/2015    Years since quitting: 1.3  . Smokeless tobacco: Never Used  . Tobacco comment: last cigarette used 10/27/15  Substance and Sexual Activity  . Alcohol  use: No  . Drug use: No  . Sexual activity: Not on file

## 2017-03-08 NOTE — Addendum Note (Signed)
Addended by: Meyer Cory on: 03/08/2017 02:34 PM   Modules accepted: Orders

## 2017-03-21 ENCOUNTER — Ambulatory Visit (INDEPENDENT_AMBULATORY_CARE_PROVIDER_SITE_OTHER): Payer: BLUE CROSS/BLUE SHIELD

## 2017-03-21 ENCOUNTER — Encounter (INDEPENDENT_AMBULATORY_CARE_PROVIDER_SITE_OTHER): Payer: Self-pay | Admitting: Physical Medicine and Rehabilitation

## 2017-03-21 ENCOUNTER — Ambulatory Visit (INDEPENDENT_AMBULATORY_CARE_PROVIDER_SITE_OTHER): Payer: BLUE CROSS/BLUE SHIELD | Admitting: Physical Medicine and Rehabilitation

## 2017-03-21 VITALS — BP 111/75 | HR 98

## 2017-03-21 DIAGNOSIS — M47816 Spondylosis without myelopathy or radiculopathy, lumbar region: Secondary | ICD-10-CM

## 2017-03-21 MED ORDER — LIDOCAINE HCL (PF) 1 % IJ SOLN
2.0000 mL | Freq: Once | INTRAMUSCULAR | Status: AC
  Administered 2017-03-21: 2 mL

## 2017-03-21 MED ORDER — METHYLPREDNISOLONE ACETATE 80 MG/ML IJ SUSP
80.0000 mg | Freq: Once | INTRAMUSCULAR | Status: AC
  Administered 2017-03-21: 80 mg

## 2017-03-21 NOTE — Patient Instructions (Signed)

## 2017-03-21 NOTE — Progress Notes (Deleted)
Pain in the center of low back for around 1 year. Worse in the last few months. Says when the pain is really bad the pain will wrap around into groin area on both sides. No pain down legs. Pain is worse with sitting.

## 2017-03-26 ENCOUNTER — Encounter (INDEPENDENT_AMBULATORY_CARE_PROVIDER_SITE_OTHER): Payer: BLUE CROSS/BLUE SHIELD | Admitting: Physical Medicine and Rehabilitation

## 2017-03-28 NOTE — Procedures (Signed)
[name] is a 46 year old female followed by Dr. Lorin Mercy.  She comes in today at his request for bilateral L4-5 facet joint blocks.  Of Center is midline low back pain worse with standing and ambulating says it can wrap around to the front area into the groin on both sides.  Is actually a referral pattern I have seen on a few occasions with L4-5 facet joints.  She does not really have pain down the legs or numbness or tingling she actually feels like sometimes the pain is worse sitting but is more sitting and then standing.  The injection  will be diagnostic and hopefully therapeutic. The patient has failed conservative care including time, medications and activity modification.  Lumbar Facet Joint Intra-Articular Injection(s) with Fluoroscopic Guidance  Patient: Theresa Mullins      Date of Birth: Mar 20, 1971 MRN: [id number] PCP: Octavio Graves, DO      Visit Date: 03/21/2017   Universal Protocol:    Date/Time: 03/21/2017  Consent Given By: the patient  Position: PRONE   Additional Comments: Vital signs were monitored before and after the procedure. Patient was prepped and draped in the usual sterile fashion. The correct patient, procedure, and site was verified.   Injection Procedure Details:  Procedure Site One Meds Administered:  Meds ordered this encounter  Medications  . lidocaine (PF) (XYLOCAINE) 1 % injection 2 mL  . methylPREDNISolone acetate (DEPO-MEDROL) injection 80 mg     Laterality: Bilateral  Location/Site:  L4-L5  Needle size: 22 guage  Needle type: Spinal  Needle Placement: Articular  Findings:  -Comments: Excellent flow of contrast producing a partial arthrogram.  Procedure Details: The fluoroscope beam is vertically oriented in AP, and the inferior recess is visualized beneath the lower pole of the inferior apophyseal process, which represents the target point for needle insertion. When direct visualization is difficult the target point is located at the  medial projection of the vertebral pedicle. The region overlying each aforementioned target is locally anesthetized with a 1 to 2 ml. volume of 1% Lidocaine without Epinephrine.   The spinal needle was inserted into each of the above mentioned facet joints using biplanar fluoroscopic guidance. A 0.25 to 0.5 ml. volume of Isovue-250 was injected and a partial facet joint arthrogram was obtained. A single spot film was obtained of the resulting arthrogram.    One to 1.25 ml of the steroid/anesthetic solution was then injected into each of the facet joints noted above.   Additional Comments:  The patient tolerated the procedure well Dressing: Band-Aid    Post-procedure details: Patient was observed during the procedure. Post-procedure instructions were reviewed.  Patient left the clinic in stable condition.  Pertinent Imaging: No specialty comments available.

## 2017-05-02 ENCOUNTER — Ambulatory Visit (INDEPENDENT_AMBULATORY_CARE_PROVIDER_SITE_OTHER): Payer: BLUE CROSS/BLUE SHIELD | Admitting: Orthopaedic Surgery

## 2017-05-02 ENCOUNTER — Encounter (INDEPENDENT_AMBULATORY_CARE_PROVIDER_SITE_OTHER): Payer: Self-pay | Admitting: Orthopaedic Surgery

## 2017-05-02 ENCOUNTER — Ambulatory Visit (INDEPENDENT_AMBULATORY_CARE_PROVIDER_SITE_OTHER): Payer: Self-pay

## 2017-05-02 VITALS — BP 136/74 | HR 94 | Ht <= 58 in | Wt 158.0 lb

## 2017-05-02 DIAGNOSIS — G5602 Carpal tunnel syndrome, left upper limb: Secondary | ICD-10-CM

## 2017-05-02 DIAGNOSIS — G5601 Carpal tunnel syndrome, right upper limb: Secondary | ICD-10-CM

## 2017-05-02 DIAGNOSIS — M25561 Pain in right knee: Secondary | ICD-10-CM

## 2017-05-02 NOTE — Addendum Note (Signed)
Addended by: Meyer Cory on: 05/02/2017 10:22 AM   Modules accepted: Orders

## 2017-05-02 NOTE — Progress Notes (Signed)
Office Visit Note   Patient: Theresa Mullins           Date of Birth: 11-20-1970           MRN: [id number] Visit Date: 05/02/2017              Requested by: Octavio Graves, [place] [address] [place],  [address] PCP: Octavio Graves, DO   Assessment & Plan: Visit Diagnoses:  1. Acute pain of right knee   2. Carpal tunnel syndrome, left upper limb   3. Carpal tunnel syndrome, right upper limb     Plan: Change electrical test for carpal tunnel syndrome right worse than the left hand and see her back after the tests for review and consider carpal tunnel release.  Take some Aleve over-the-counter which she tolerates well for knee and see if it improves her symptoms.  She can work on exercise bike and walk on a treadmill some.  Office follow-up after electrical test to evaluate her carpal tunnel.  Follow-Up Instructions: No Follow-up on file.   Orders:  Orders Placed This Encounter  Procedures  . XR KNEE 3 VIEW RIGHT   No orders of the defined types were placed in this encounter.     Procedures: No procedures performed   Clinical Data: No additional findings.   Subjective: Chief Complaint  Patient presents with  . Right Knee - Pain    HPI returns she states she slipped on a slick piece of wood around Christmas and twisted her right knee with persistent pain since that time.  She has been using a brace she takes chronic oxycodone or possibly hydrocodone she is not really sure.  She is somewhat poor historian may be related to the medication for pain that she is taking.  She has had increased problems with her hand numbness and had previous test many years ago for carpal tunnel which was positive and she states surgery was discussed but she could not afford to get it done at the time.  Right hand bothers it worse than the left knee H she is now ready to proceed due to increased problems.  Review of Systems updated 14 point review of systems.  Her back is better after the  epidural.  Otherwise negative as it pertains HPI other than the knee injury at Christmas time and continued hand numbness problems with carpal tunnel.   Objective: Vital Signs: BP 136/74   Pulse 94   Ht 4\' 7"  (1.397 m)   Wt 158 lb (71.7 kg)   BMI 36.72 kg/m   Physical Exam  Constitutional: She is oriented to person, place, and time. She appears well-developed.  HENT:  Head: Normocephalic.  Right Ear: External ear normal.  Left Ear: External ear normal.  Eyes: Pupils are equal, round, and reactive to light.  Neck: No tracheal deviation present. No thyromegaly present.  Cardiovascular: Normal rate.  Pulmonary/Chest: Effort normal.  Abdominal: Soft.  Neurological: She is alert and oriented to person, place, and time.  Skin: Skin is warm and dry.  Psychiatric: She has a normal mood and affect. Her behavior is normal.    Ortho Exam positive Phalen's test right worse than left positive carpal compression test.  Collateral ligaments of the right knee are normal no knee effusion.  Mild tenderness along the medial aspect of the patella and distal quad tendon with no weakness.  No palpable defect.  Normal patellar tracking.  Negative crepitus with extension.  ACL and collateral ligament  exam is normal.  Specialty Comments:  No specialty comments available.  Imaging: Xr Knee 3 View Right  Result Date: 05/02/2017 Three-view x-rays right knee obtained and reviewed.  This shows 20% medial joint line narrowing.  Negative for acute changes.  No sclerotic or cystic changes no acute findings. Impression: Normal right knee x-rays negative for acute changes.    PMFS History: Patient Active Problem List   Diagnosis Date Noted  . Acute pain of right knee 05/02/2017  . Facet arthritis of lumbar region ([place]) 03/08/2017  . Carpal tunnel syndrome, left upper limb 03/08/2017  . Carpal tunnel syndrome, right upper limb 03/08/2017  . Abnormality of gait 01/18/2016  . Vertigo 01/18/2016  . Colitis  03/20/2012  . Rectal bleeding 03/20/2012  . Hypokalemia 03/20/2012  . Hypertension 03/20/2012  . Anxiety 03/20/2012   Past Medical History:  Diagnosis Date  . ADD (attention deficit disorder)   . Anxiety   . Anxiety   . Carpal tunnel syndrome   . Cerebellar bleed ([place])   . Depression   . DUB (dysfunctional uterine bleeding)   . Dysmenorrhea   . High cholesterol   . Hip pain   . Hypertension   . Joint pain   . Mood disorder ([place])   . Restless legs   . Vertigo     Family History  Problem Relation Age of Onset  . Mental illness Mother   . Hypertension Mother   . Depression Mother   . CAD Mother   . Diabetes Mother   . Melanoma Mother   . Mental illness Father   . Heart attack Father   . Aneurysm Father     Past Surgical History:  Procedure Laterality Date  . BRAIN SURGERY     Social History   Occupational History  . Occupation: Disabled  Tobacco Use  . Smoking status: Former Smoker    Packs/day: 0.50    Last attempt to quit: 10/22/2015    Years since quitting: 1.5  . Smokeless tobacco: Never Used  . Tobacco comment: last cigarette used 10/27/15  Substance and Sexual Activity  . Alcohol use: No  . Drug use: No  . Sexual activity: Not on file

## 2017-06-14 ENCOUNTER — Telehealth (INDEPENDENT_AMBULATORY_CARE_PROVIDER_SITE_OTHER): Payer: Self-pay | Admitting: *Deleted

## 2017-06-14 NOTE — Telephone Encounter (Signed)
ok 

## 2017-07-01 ENCOUNTER — Ambulatory Visit (INDEPENDENT_AMBULATORY_CARE_PROVIDER_SITE_OTHER): Payer: BLUE CROSS/BLUE SHIELD | Admitting: Physical Medicine and Rehabilitation

## 2017-07-01 ENCOUNTER — Ambulatory Visit (INDEPENDENT_AMBULATORY_CARE_PROVIDER_SITE_OTHER): Payer: BLUE CROSS/BLUE SHIELD

## 2017-07-01 ENCOUNTER — Encounter (INDEPENDENT_AMBULATORY_CARE_PROVIDER_SITE_OTHER): Payer: Self-pay | Admitting: Physical Medicine and Rehabilitation

## 2017-07-01 VITALS — BP 137/89 | HR 99 | Temp 98.1°F

## 2017-07-01 DIAGNOSIS — M47816 Spondylosis without myelopathy or radiculopathy, lumbar region: Secondary | ICD-10-CM

## 2017-07-01 MED ORDER — METHYLPREDNISOLONE ACETATE 80 MG/ML IJ SUSP
80.0000 mg | Freq: Once | INTRAMUSCULAR | Status: AC
  Administered 2017-07-01: 80 mg

## 2017-07-01 NOTE — Patient Instructions (Signed)

## 2017-07-01 NOTE — Progress Notes (Signed)
 .  Numeric Pain Rating Scale and Functional Assessment Average Pain 5   In the last MONTH (on 0-10 scale) has pain interfered with the following?  1. General activity like being  able to carry out your everyday physical activities such as walking, climbing stairs, carrying groceries, or moving a chair?  Rating(3)   +Driver, -BT, -Dye Allergies.  

## 2017-07-01 NOTE — Procedures (Signed)
Lumbar Facet Joint Intra-Articular Injection(s) with Fluoroscopic Guidance  Patient: Theresa Mullins      Date of Birth: 1970/06/03 MRN: [id number] PCP: Octavio Graves, DO      Visit Date: 07/01/2017   Universal Protocol:    Date/Time: 07/01/2017  Consent Given By: the patient  Position: PRONE   Additional Comments: Vital signs were monitored before and after the procedure. Patient was prepped and draped in the usual sterile fashion. The correct patient, procedure, and site was verified.   Injection Procedure Details:  Procedure Site One Meds Administered:  Meds ordered this encounter  Medications  . methylPREDNISolone acetate (DEPO-MEDROL) injection 80 mg     Laterality: Bilateral  Location/Site:  L4-L5  Needle size: 22 guage  Needle type: Spinal  Needle Placement: Articular  Findings:  -Comments: Excellent flow of contrast producing a partial arthrogram.  Procedure Details: The fluoroscope beam is vertically oriented in AP, and the inferior recess is visualized beneath the lower pole of the inferior apophyseal process, which represents the target point for needle insertion. When direct visualization is difficult the target point is located at the medial projection of the vertebral pedicle. The region overlying each aforementioned target is locally anesthetized with a 1 to 2 ml. volume of 1% Lidocaine without Epinephrine.   The spinal needle was inserted into each of the above mentioned facet joints using biplanar fluoroscopic guidance. A 0.25 to 0.5 ml. volume of Isovue-250 was injected and a partial facet joint arthrogram was obtained. A single spot film was obtained of the resulting arthrogram.    One to 1.25 ml of the steroid/anesthetic solution was then injected into each of the facet joints noted above.   Additional Comments:  The patient tolerated the procedure well Dressing: Band-Aid    Post-procedure details: Patient was observed during the  procedure. Post-procedure instructions were reviewed.  Patient left the clinic in stable condition.

## 2017-07-01 NOTE — Progress Notes (Signed)
Theresa Mullins - 47 y.o. female MRN [id number]  Date of birth: 12/07/1970  Office Visit Note: Visit Date: 07/01/2017 PCP: Octavio Graves, DO Referred by: Octavio Graves, DO  Subjective: Chief Complaint  Patient presents with  . Lower Back - Pain  . Right Leg - Pain  . Left Leg - Pain   HPI: [name] is a 47 year old female with a history of chronic low back pain axial pain worse with standing and ambulating and activity.  We completed diagnostic facet joint blocks at L4-5 back in November with really good relief until she had a fall.  She evidently fell down some stairs and twisted her knee.  Since that time she has had worsening back pain without leg pain.  He does request repeat facet joint block.  He also has a request in for electrodiagnostic studies of both hands looking for carpal tunnel and will see her for that on the 29th.  She will continue to follow-up with him for orthopedic care.    ROS Otherwise per HPI.  Assessment & Plan: Visit Diagnoses:  1. Spondylosis without myelopathy or radiculopathy, lumbar region     Plan: No additional findings.   Meds & Orders:  Meds ordered this encounter  Medications  . methylPREDNISolone acetate (DEPO-MEDROL) injection 80 mg    Orders Placed This Encounter  Procedures  . Facet Injection  . XR C-ARM NO REPORT    Follow-up: Return if symptoms worsen or fail to improve, for Dr. Lorin Mercy.   Procedures: No procedures performed  Lumbar Facet Joint Intra-Articular Injection(s) with Fluoroscopic Guidance  Patient: Theresa Mullins      Date of Birth: 30-Jun-1970 MRN: [id number] PCP: Octavio Graves, DO      Visit Date: 07/01/2017   Universal Protocol:    Date/Time: 07/01/2017  Consent Given By: the patient  Position: PRONE   Additional Comments: Vital signs were monitored before and after the procedure. Patient was prepped and draped in the usual sterile fashion. The correct patient, procedure, and site was verified.   Injection  Procedure Details:  Procedure Site One Meds Administered:  Meds ordered this encounter  Medications  . methylPREDNISolone acetate (DEPO-MEDROL) injection 80 mg     Laterality: Bilateral  Location/Site:  L4-L5  Needle size: 22 guage  Needle type: Spinal  Needle Placement: Articular  Findings:  -Comments: Excellent flow of contrast producing a partial arthrogram.  Procedure Details: The fluoroscope beam is vertically oriented in AP, and the inferior recess is visualized beneath the lower pole of the inferior apophyseal process, which represents the target point for needle insertion. When direct visualization is difficult the target point is located at the medial projection of the vertebral pedicle. The region overlying each aforementioned target is locally anesthetized with a 1 to 2 ml. volume of 1% Lidocaine without Epinephrine.   The spinal needle was inserted into each of the above mentioned facet joints using biplanar fluoroscopic guidance. A 0.25 to 0.5 ml. volume of Isovue-250 was injected and a partial facet joint arthrogram was obtained. A single spot film was obtained of the resulting arthrogram.    One to 1.25 ml of the steroid/anesthetic solution was then injected into each of the facet joints noted above.   Additional Comments:  The patient tolerated the procedure well Dressing: Band-Aid    Post-procedure details: Patient was observed during the procedure. Post-procedure instructions were reviewed.  Patient left the clinic in stable condition.    Clinical History: Lspine MRI 8/18 [place]   L4-5 mild  facet OA and edema and smal central protrusion   She reports that she quit smoking about 20 months ago. She smoked 0.50 packs per day. she has never used smokeless tobacco. No results for input(s): HGBA1C, LABURIC in the last 8760 hours.  Objective:  VS:  HT:    WT:   BMI:     BP:137/89  HR:99bpm  TEMP:98.1 F (36.7 C)(Oral)  RESP:100 % Physical Exam    Musculoskeletal:  Patient is somewhat slow to rise from a seated position does have pain her spine.  She does have some paraspinal tightness.  She has good distal strength.    Ortho Exam Imaging: Xr C-arm No Report  Result Date: 07/01/2017 Please see Notes or Procedures tab for imaging impression.   Past Medical/Family/Surgical/Social History: Medications & Allergies reviewed per EMR, new medications updated. Patient Active Problem List   Diagnosis Date Noted  . Acute pain of right knee 05/02/2017  . Facet arthritis of lumbar region ([place]) 03/08/2017  . Carpal tunnel syndrome, left upper limb 03/08/2017  . Carpal tunnel syndrome, right upper limb 03/08/2017  . Abnormality of gait 01/18/2016  . Vertigo 01/18/2016  . Colitis 03/20/2012  . Rectal bleeding 03/20/2012  . Hypokalemia 03/20/2012  . Hypertension 03/20/2012  . Anxiety 03/20/2012   Past Medical History:  Diagnosis Date  . ADD (attention deficit disorder)   . Anxiety   . Anxiety   . Carpal tunnel syndrome   . Cerebellar bleed ([place])   . Depression   . DUB (dysfunctional uterine bleeding)   . Dysmenorrhea   . High cholesterol   . Hip pain   . Hypertension   . Joint pain   . Mood disorder ([place])   . Restless legs   . Vertigo    Family History  Problem Relation Age of Onset  . Mental illness Mother   . Hypertension Mother   . Depression Mother   . CAD Mother   . Diabetes Mother   . Melanoma Mother   . Mental illness Father   . Heart attack Father   . Aneurysm Father    Past Surgical History:  Procedure Laterality Date  . BRAIN SURGERY     Social History   Occupational History  . Occupation: Disabled  Tobacco Use  . Smoking status: Former Smoker    Packs/day: 0.50    Last attempt to quit: 10/22/2015    Years since quitting: 1.6  . Smokeless tobacco: Never Used  . Tobacco comment: last cigarette used 10/27/15  Substance and Sexual Activity  . Alcohol use: No  . Drug use: No  . Sexual activity: Not  on file

## 2017-07-19 ENCOUNTER — Encounter (INDEPENDENT_AMBULATORY_CARE_PROVIDER_SITE_OTHER): Payer: Self-pay | Admitting: Physical Medicine and Rehabilitation

## 2017-08-02 ENCOUNTER — Encounter (INDEPENDENT_AMBULATORY_CARE_PROVIDER_SITE_OTHER): Payer: Self-pay | Admitting: Physical Medicine and Rehabilitation

## 2017-10-10 IMAGING — CT CT HEAD W/O CM
3 series · 16 of 46 positions shown, 19 images · non-contrast
Comparison: Head CT 11/23/2015

CLINICAL DATA: Ambulatory difficulties.  Weakness for 2 weeks.

EXAM:
CT HEAD WITHOUT CONTRAST
TECHNIQUE: Contiguous axial images were obtained from the base of the skull
through the vertex without intravenous contrast.

[Series 2: head wo · axial · 0.40mm/px · z∈[+261,+381]mm · 10 of 29 slices shown, 13 images]
[im 3/29  brain]
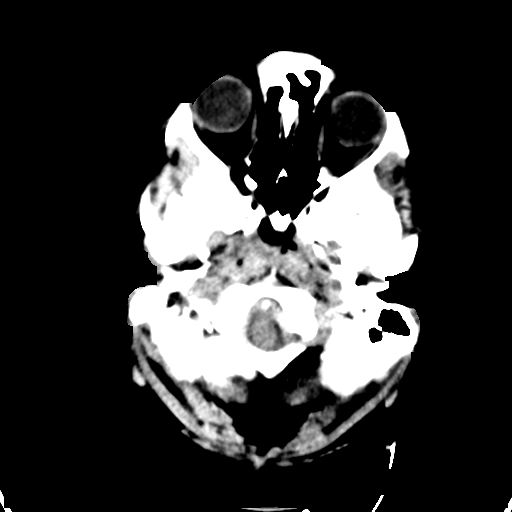
[im 3/29  bone]
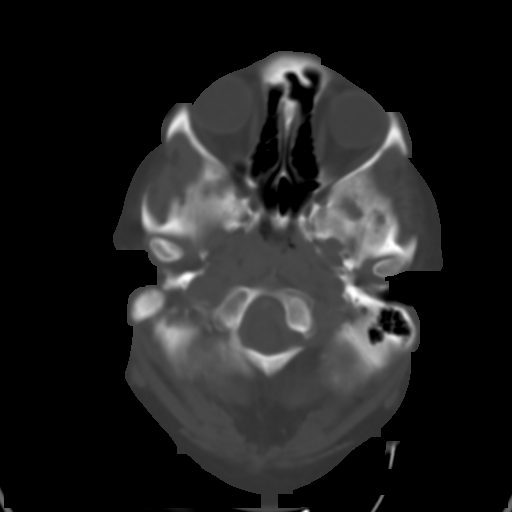
[im 6/29  brain]
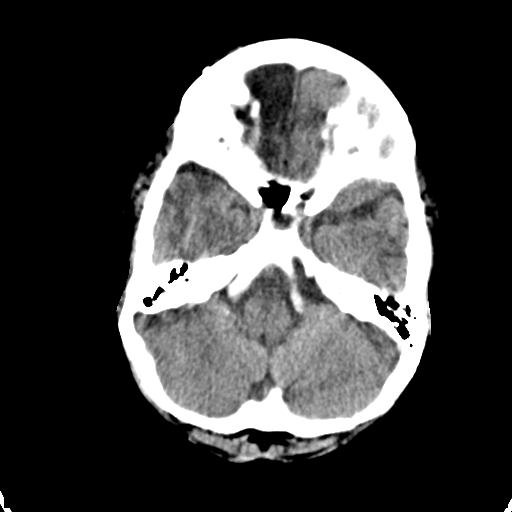
[im 8/29  brain]
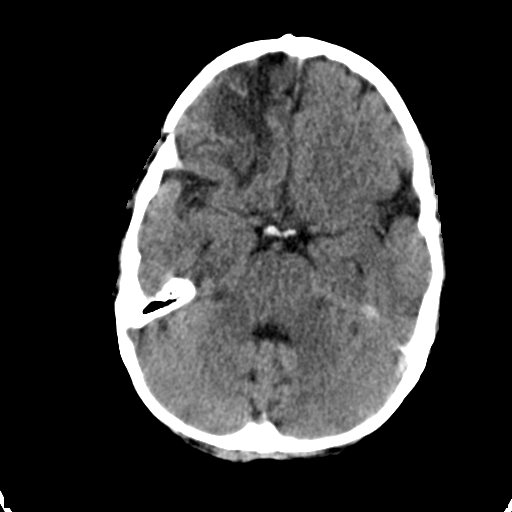
[im 11/29  brain]
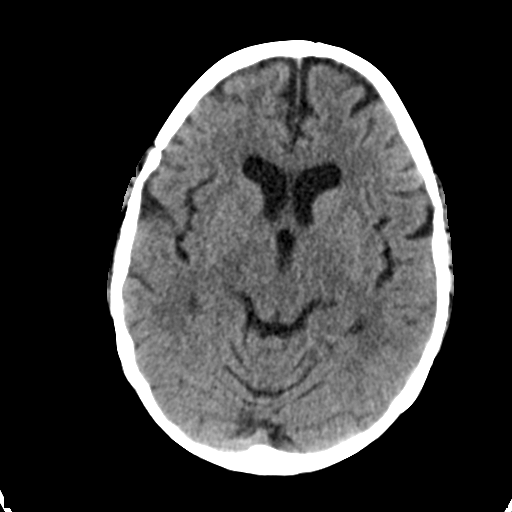
[im 14/29  brain]
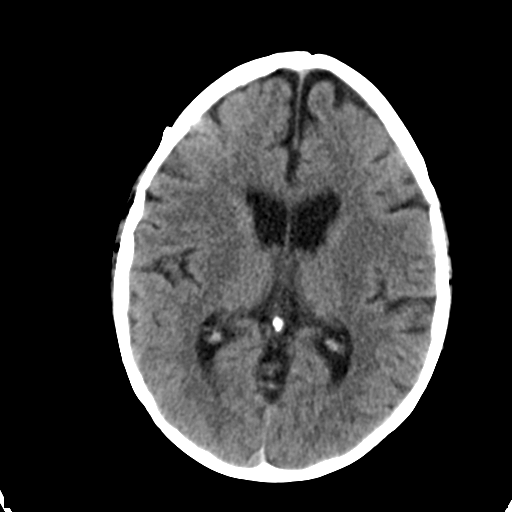
[im 14/29  bone]
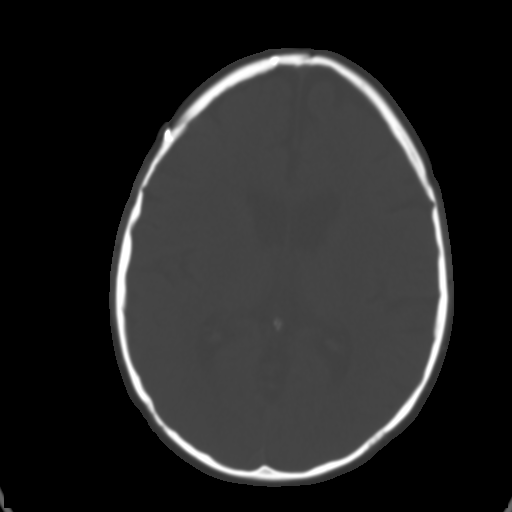
[im 16/29  brain]
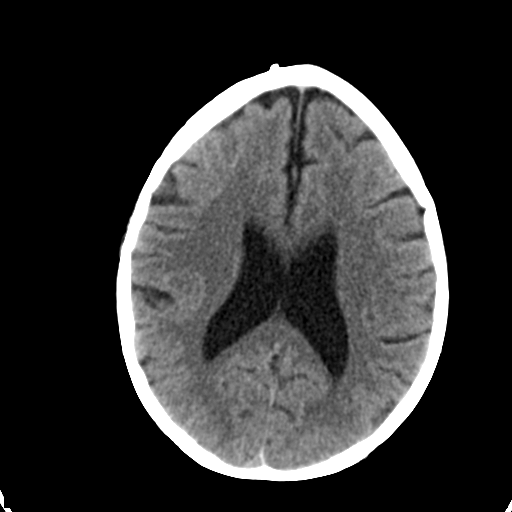
[im 19/29  brain]
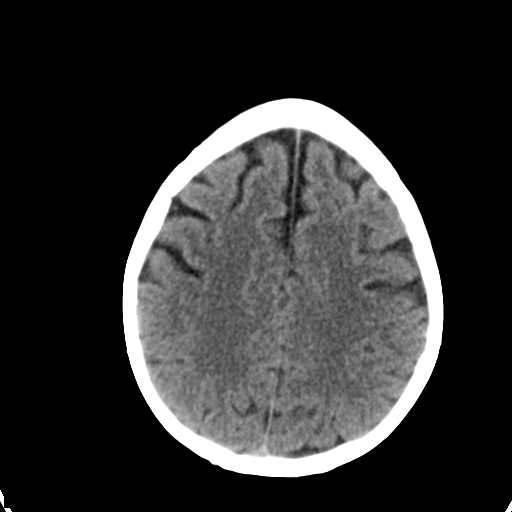
[im 22/29  brain]
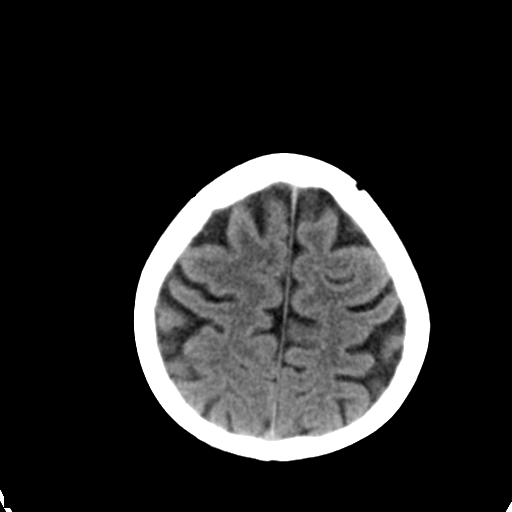
[im 24/29  brain]
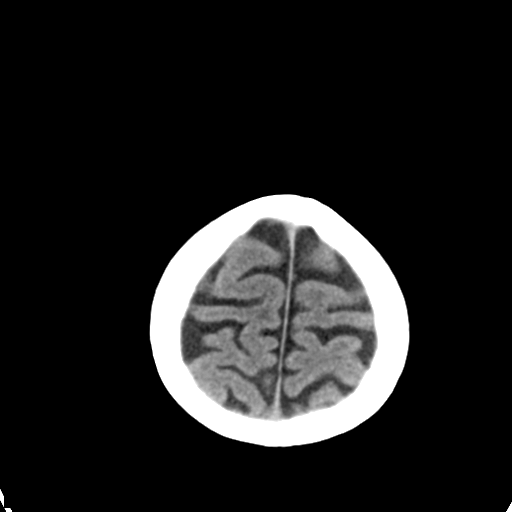
[im 24/29  bone]
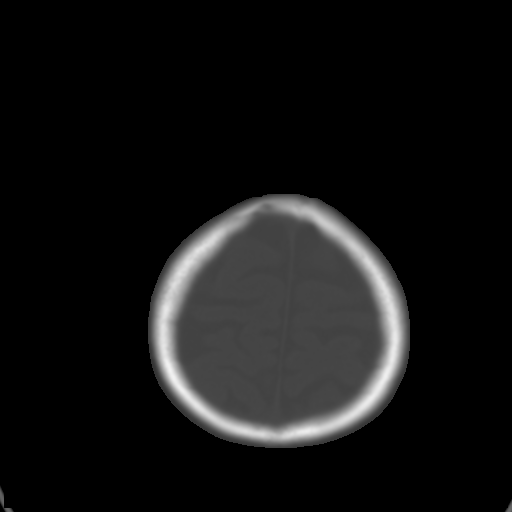
[im 27/29  brain]
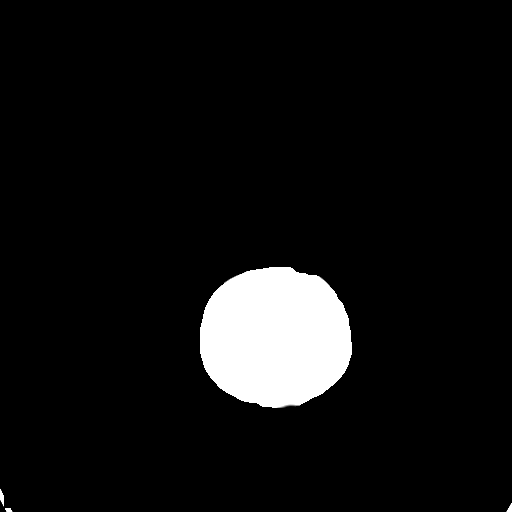

[Series 4: coronal soft tissue · coronal · 0.29mm/px · 3 of 67 slices shown]
[im 23/67  brain]
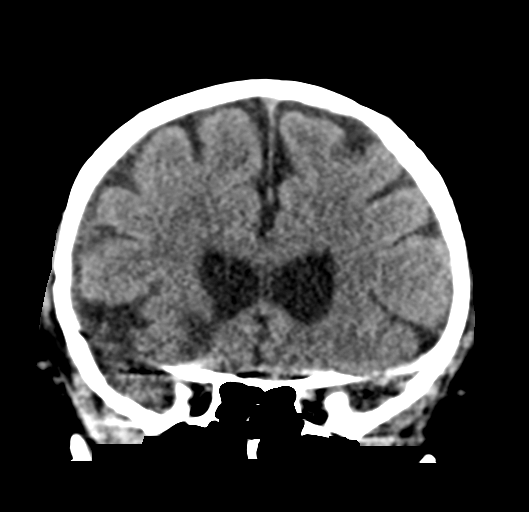
[im 30/67  brain]
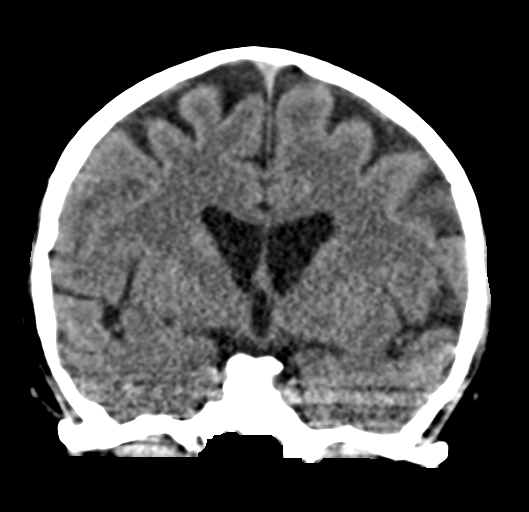
[im 37/67  brain]
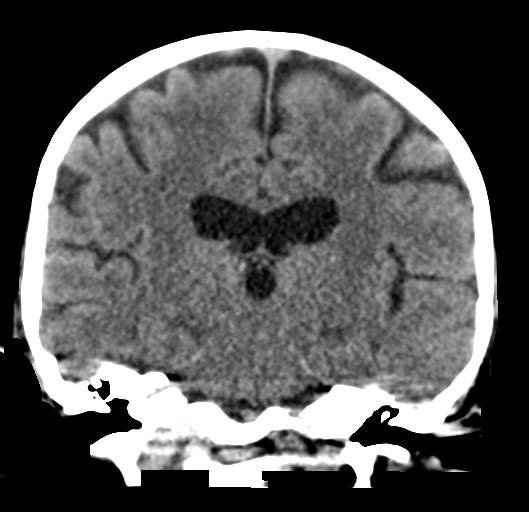

[Series 5: sagittal soft tissue · sagittal · 0.31mm/px · 3 of 49 slices shown]
[im 17/49  brain]
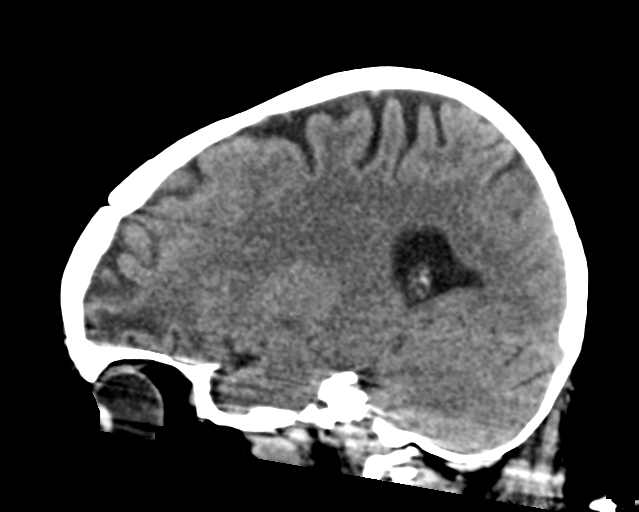
[im 25/49  brain]
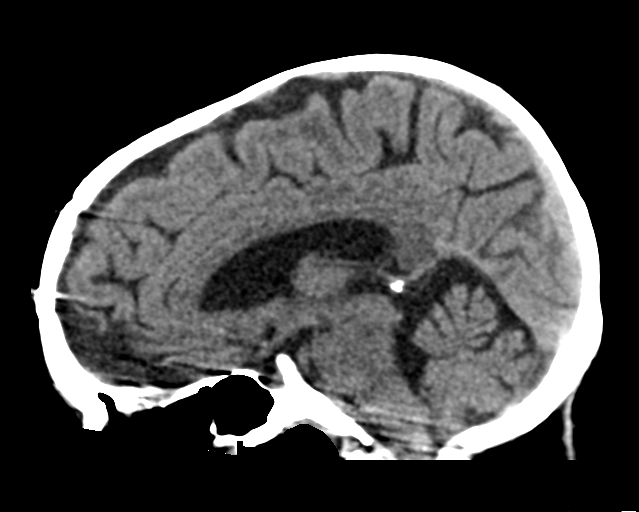
[im 33/49  brain]
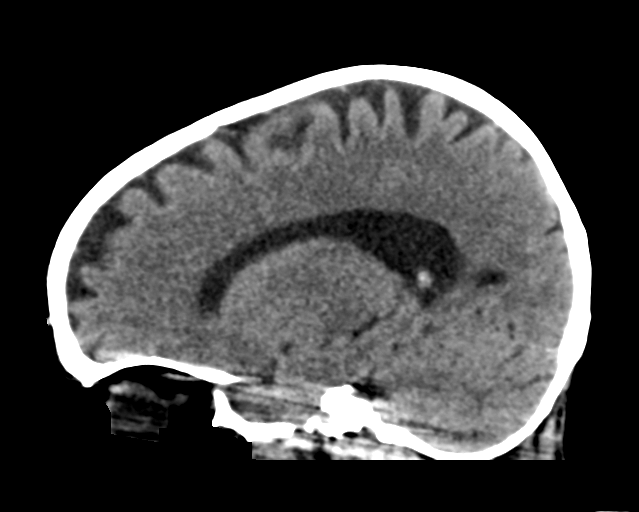

[16 of 46 positions shown; findings below may reference images not displayed]

FINDINGS: Brain: No mass lesion, intraparenchymal hemorrhage or extra-axial
collection. No evidence of acute cortical infarct. Unchanged right
frontal encephalomalacia.

Vascular: No hyperdense vessel or unexpected calcification.

Skull: Remote right frontal craniotomy.

Sinuses/Orbits: No sinus fluid levels or advanced mucosal
thickening. No mastoid effusion. Normal orbits.
IMPRESSION: 1. No acute intracranial abnormality.
2. Unchanged right frontal encephalomalacia.

## 2018-06-19 ENCOUNTER — Ambulatory Visit (INDEPENDENT_AMBULATORY_CARE_PROVIDER_SITE_OTHER): Payer: Medicare Other | Admitting: Orthopaedic Surgery

## 2018-06-19 ENCOUNTER — Encounter (INDEPENDENT_AMBULATORY_CARE_PROVIDER_SITE_OTHER): Payer: Self-pay | Admitting: Orthopaedic Surgery

## 2018-06-19 VITALS — BP 133/87 | HR 83 | Ht <= 58 in | Wt 145.0 lb

## 2018-06-19 DIAGNOSIS — G5601 Carpal tunnel syndrome, right upper limb: Secondary | ICD-10-CM

## 2018-06-19 DIAGNOSIS — G5602 Carpal tunnel syndrome, left upper limb: Secondary | ICD-10-CM | POA: Diagnosis not present

## 2018-06-19 NOTE — Progress Notes (Signed)
Office Visit Note   Patient: Theresa Mullins           Date of Birth: February 06, 1971           MRN: [id number] Visit Date: 06/19/2018              Requested by: Octavio Graves, [place] [address] [place], [place] [address] PCP: Octavio Graves, DO   Assessment & Plan: Visit Diagnoses:  1. Carpal tunnel syndrome, right upper limb   2. Carpal tunnel syndrome, left upper limb     Plan: Request for proceeding with right carpal tunnel release.  Originally surgery was recommended back in 2016 one-point she was scheduled for surgery in 2019 and then canceled.  She states she has had increased symptoms with numbness tingling it wakes her up at night she is worn wrist splints taken anti-inflammatories drops objects with daily activities and states she like to proceed with surgical intervention.  Patient said cortisone injections in the past  , used the wrist splint also anti-inflammatories without relief.  Patient states she like to proceed with outpatient carpal tunnel release.  Follow-Up Instructions: No follow-ups on file.   Orders:  No orders of the defined types were placed in this encounter.  No orders of the defined types were placed in this encounter.     Procedures: No procedures performed   Clinical Data: No additional findings.   Subjective: No chief complaint on file.   HPI 48 year old female returns with gradually increasing right hand numbness tingling.  She is had positive carpal tunnel syndrome diagnosed several years ago and patient was switched insurance and states that her pain is progressed she has more symptoms on the right than left hand and is ready to proceed with right carpal tunnel release.  She previously was scheduled back in 2016.  She had to use wrist splints used ibuprofen Tylenol with persistent symptoms.  Review of Systems Review of systems positive for previous lumbar epidural.  Bilateral carpal tunnel syndrome otherwise negative as it pertains  HPI.  Objective: Vital Signs: BP 133/87   Pulse 83   Ht 4\' 7"  (1.397 m)   Wt 145 lb (65.8 kg)   BMI 33.70 kg/m   Physical Exam Constitutional:      Appearance: She is well-developed.  HENT:     Head: Normocephalic.     Right Ear: External ear normal.     Left Ear: External ear normal.  Eyes:     Pupils: Pupils are equal, round, and reactive to light.  Neck:     Thyroid: No thyromegaly.     Trachea: No tracheal deviation.  Cardiovascular:     Rate and Rhythm: Normal rate.  Pulmonary:     Effort: Pulmonary effort is normal.  Abdominal:     Palpations: Abdomen is soft.  Skin:    General: Skin is warm and dry.  Neurological:     Mental Status: She is alert and oriented to person, place, and time.  Psychiatric:        Behavior: Behavior normal.     Ortho Exam patient has good cervical range of motion positive Tinel's positive Phalen's both right and left.  No thenar atrophy.  Normal well biceps triceps brachial radialis reflex.  Specialty Comments:  No specialty comments available.  Imaging: No results found.   PMFS History: Patient Active Problem List   Diagnosis Date Noted  . Acute pain of right knee 05/02/2017  . Facet arthritis of lumbar region  03/08/2017  . Carpal tunnel syndrome, left upper limb 03/08/2017  . Carpal tunnel syndrome, right upper limb 03/08/2017  . Abnormality of gait 01/18/2016  . Vertigo 01/18/2016  . Colitis 03/20/2012  . Rectal bleeding 03/20/2012  . Hypokalemia 03/20/2012  . Hypertension 03/20/2012  . Anxiety 03/20/2012   Past Medical History:  Diagnosis Date  . ADD (attention deficit disorder)   . Anxiety   . Anxiety   . Carpal tunnel syndrome   . Cerebellar bleed ([place])   . Depression   . DUB (dysfunctional uterine bleeding)   . Dysmenorrhea   . High cholesterol   . Hip pain   . Hypertension   . Joint pain   . Mood disorder ([place])   . Restless legs   . Vertigo     Family History  Problem Relation Age of Onset  .  Mental illness Mother   . Hypertension Mother   . Depression Mother   . CAD Mother   . Diabetes Mother   . Melanoma Mother   . Mental illness Father   . Heart attack Father   . Aneurysm Father     Past Surgical History:  Procedure Laterality Date  . BRAIN SURGERY     Social History   Occupational History  . Occupation: Disabled  Tobacco Use  . Smoking status: Former Smoker    Packs/day: 0.50    Last attempt to quit: 10/22/2015    Years since quitting: 2.6  . Smokeless tobacco: Never Used  . Tobacco comment: last cigarette used 10/27/15  Substance and Sexual Activity  . Alcohol use: No  . Drug use: No  . Sexual activity: Not on file

## 2018-06-20 ENCOUNTER — Telehealth (INDEPENDENT_AMBULATORY_CARE_PROVIDER_SITE_OTHER): Payer: Self-pay | Admitting: Orthopaedic Surgery

## 2018-06-20 NOTE — Telephone Encounter (Signed)
FYI:  Patient is scheduled for Right [facility] Tunnel [facility] Lorin Mercy.  Post -op appointment set for 07-17-18 at [facility] office.  Patient aware of location, date, and time.

## 2018-06-20 NOTE — Telephone Encounter (Signed)
noted 

## 2018-06-22 ENCOUNTER — Encounter (INDEPENDENT_AMBULATORY_CARE_PROVIDER_SITE_OTHER): Payer: Self-pay | Admitting: Orthopaedic Surgery

## 2018-06-30 NOTE — Telephone Encounter (Signed)
FYI

## 2018-06-30 NOTE — Telephone Encounter (Signed)
Patient called to cancel her surgery for right carpal tunnel release with Dr. Lorin Mercy on March 16th..  She has been in [facility] for a week now with her boyfriend because he had to have emergency surgery.  She will call in the near future to reschedule.

## 2020-02-02 ENCOUNTER — Encounter: Payer: Self-pay | Admitting: Internal Medicine

## 2020-02-25 ENCOUNTER — Ambulatory Visit (INDEPENDENT_AMBULATORY_CARE_PROVIDER_SITE_OTHER): Payer: Medicare Other | Admitting: Internal Medicine

## 2020-02-25 ENCOUNTER — Ambulatory Visit: Payer: Medicare Other | Admitting: Internal Medicine

## 2020-02-25 ENCOUNTER — Encounter: Payer: Self-pay | Admitting: Internal Medicine

## 2020-02-25 ENCOUNTER — Other Ambulatory Visit: Payer: Self-pay

## 2020-02-25 VITALS — BP 134/86 | HR 101 | Temp 96.8°F | Ht <= 58 in | Wt 160.2 lb

## 2020-02-25 DIAGNOSIS — R110 Nausea: Secondary | ICD-10-CM

## 2020-02-25 DIAGNOSIS — R140 Abdominal distension (gaseous): Secondary | ICD-10-CM

## 2020-02-25 DIAGNOSIS — R197 Diarrhea, unspecified: Secondary | ICD-10-CM

## 2020-02-25 DIAGNOSIS — R1033 Periumbilical pain: Secondary | ICD-10-CM | POA: Diagnosis not present

## 2020-02-25 NOTE — H&P (View-Only) (Signed)
Primary Care Physician:  Wannetta Sender, FNP Primary Gastroenterologist:  Dr. Abbey Chatters  Chief Complaint  Patient presents with  . Abdominal Pain  . Nausea    no vomiting  . Diarrhea    almost daily, yellow; has a lot of stress  . Bloated    HPI:   Theresa Mullins is a 49 y.o. female who presents to the clinic today by referral from her PCP Loletha Grayer for evaluation.  Patient has multiple GI complaints for me today.  She states she has had GI issues her whole life.  She knows she has severe underlying anxiety which she attributes to her GI symptoms.  Notes chronic diarrhea for over a year.  Up to 3-4 bowel movements daily which are loose.  Sometimes she has to run to the bathroom.  Does not appear to be diet related that she can think of.  No melena or hematochezia.  No chronic NSAID use.  She had a CT scan back in 2013 that did show sigmoid colitis.  She states this was due to "a parasite."  No previous colonoscopy.  No family history of colorectal malignancy.  Also notes chronic nausea for which she takes Phenergan.  Has abdominal bloating as well.  Previously had reflux which she states has been maintained just with a better diet.  No previous EGD.  Denies any dysphagia or odynophagia.  Past Medical History:  Diagnosis Date  . ADD (attention deficit disorder)   . Anxiety   . Anxiety   . Carpal tunnel syndrome   . Cerebellar bleed ([place])   . Depression   . DUB (dysfunctional uterine bleeding)   . Dysmenorrhea   . High cholesterol   . Hip pain   . Hypertension   . Joint pain   . Mood disorder ([place])   . Restless legs   . Vertigo     Past Surgical History:  Procedure Laterality Date  . BRAIN SURGERY      Current Outpatient Medications  Medication Sig Dispense Refill  . acetaminophen (TYLENOL) 500 MG tablet Take 1,500 mg by mouth daily as needed for headache.    . ALPRAZolam (XANAX) 0.5 MG tablet Take 0.5 mg by mouth 2 (two) times daily.    .  butalbital-acetaminophen-caffeine (FIORICET, ESGIC) 50-325-40 MG tablet Take 1 tablet by mouth every 6 (six) hours as needed for headache. 10 tablet 3  . cetirizine (ZYRTEC) 10 MG chewable tablet Chew 10 mg by mouth daily.    . fluticasone (FLONASE) 50 MCG/ACT nasal spray Place 1 spray into both nostrils daily.    Marland Kitchen gabapentin (NEURONTIN) 100 MG capsule Take 100 mg by mouth 2 (two) times daily.   0  . Gluc-Chonn-MSM-Boswellia-Vit D (GLUCOSAMINE CHONDROITIN + D3 PO) Take 1 tablet by mouth 2 (two) times daily.    Marland Kitchen lisinopril (PRINIVIL,ZESTRIL) 10 MG tablet Take 10 mg by mouth daily.    . meclizine (ANTIVERT) 25 MG tablet Take 25 mg by mouth as needed.     . Nutritional Supplements (ESTROVEN PO) Take 1 tablet by mouth daily.    . Potassium 99 MG TABS Take 1 tablet by mouth daily.    . promethazine (PHENERGAN) 25 MG tablet Take 25 mg by mouth as needed.   0  . traZODone (DESYREL) 100 MG tablet Take 100 mg by mouth at bedtime.    Marland Kitchen venlafaxine (EFFEXOR) 75 MG tablet Take 75 mg by mouth daily. Takes in morning    . venlafaxine XR (EFFEXOR-XR)  37.5 MG 24 hr capsule Take 37.5 mg by mouth at bedtime.     . vitamin B-12 (CYANOCOBALAMIN) 1000 MCG tablet Take 1,000 mcg by mouth daily.    Marland Kitchen ALPRAZolam (XANAX) 1 MG tablet Take 1 mg by mouth 4 (four) times daily. (Patient not taking: Reported on 02/25/2020)    . clonazePAM (KLONOPIN) 0.5 MG tablet  (Patient not taking: Reported on 02/25/2020)  1  . diazepam (VALIUM) 10 MG tablet Take 5-10 mg by mouth every 8 (eight) hours as needed for anxiety.  (Patient not taking: Reported on 02/25/2020)    . divalproex (DEPAKOTE ER) 500 MG 24 hr tablet Take 1 tablet (500 mg total) by mouth at bedtime. (Patient not taking: Reported on 06/19/2018) 30 tablet 11  . doxycycline (VIBRA-TABS) 100 MG tablet TAKE ONE (1) CAPSULE EACH DAY (Patient not taking: Reported on 02/25/2020) 30 tablet 0  . ferrous sulfate 325 (65 FE) MG tablet Take 325 mg by mouth at bedtime. (Patient not taking:  Reported on 02/25/2020)    . furosemide (LASIX) 20 MG tablet TAKE ONE (1) TABLET EACH DAY (Patient not taking: Reported on 06/19/2018) 30 tablet 6  . gemfibrozil (LOPID) 600 MG tablet Take 600 mg by mouth 2 (two) times daily.  (Patient not taking: Reported on 02/25/2020)    . HYDROcodone-acetaminophen (NORCO) 10-325 MG tablet Take 1 tablet by mouth every 8 (eight) hours as needed for moderate pain or severe pain.  (Patient not taking: Reported on 02/25/2020)  0  . ibuprofen (ADVIL,MOTRIN) 200 MG tablet Take 200 mg by mouth every 6 (six) hours as needed for mild pain or moderate pain. (Patient not taking: Reported on 02/25/2020)    . ibuprofen (ADVIL,MOTRIN) 800 MG tablet Take 800 mg by mouth 3 (three) times daily. (Patient not taking: Reported on 02/25/2020)    . lamoTRIgine (LAMICTAL) 200 MG tablet Take 200 mg by mouth 2 (two) times daily. For mood (Patient not taking: Reported on 02/25/2020)    . methylphenidate (RITALIN) 10 MG tablet Take 10 mg by mouth 2 (two) times daily. (Patient not taking: Reported on 02/25/2020)    . Multiple Vitamin (MULTIVITAMIN) tablet Take 1 tablet by mouth 2 (two) times daily.  (Patient not taking: Reported on 02/25/2020)    . nortriptyline (PAMELOR) 25 MG capsule Take 25 mg by mouth 3 (three) times daily. (Patient not taking: Reported on 02/25/2020)    . ondansetron (ZOFRAN ODT) 4 MG disintegrating tablet Take 1 tablet (4 mg total) by mouth every 8 (eight) hours as needed for nausea or vomiting. (Patient not taking: Reported on 06/19/2018) 20 tablet 0  . oxyCODONE-acetaminophen (PERCOCET/ROXICET) 5-325 MG tablet Take by mouth every 4 (four) hours as needed for severe pain. (Patient not taking: Reported on 02/25/2020)    . sertraline (ZOLOFT) 50 MG tablet Take 50 mg by mouth 2 (two) times daily.  (Patient not taking: Reported on 02/25/2020)    . traZODone (DESYREL) 50 MG tablet Take 50 mg by mouth at bedtime. (Patient not taking: Reported on 02/25/2020)     No current  facility-administered medications for this visit.    Allergies as of 02/25/2020 - Review Complete 02/25/2020  Allergen Reaction Noted  . Ciprofloxacin Anaphylaxis 10/15/2013  . Depakote [divalproex sodium]  02/25/2020  . Remeron [mirtazapine]  03/20/2012  . Contrast media [iodinated diagnostic agents] Rash 01/02/2016    Family History  Problem Relation Age of Onset  . Mental illness Mother   . Hypertension Mother   . Depression Mother   .  CAD Mother   . Diabetes Mother   . Melanoma Mother   . Mental illness Father   . Heart attack Father   . Aneurysm Father     Social History   Socioeconomic History  . Marital status: Single    Spouse name: Not on file  . Number of children: 0  . Years of education: GED  . Highest education level: Not on file  Occupational History  . Occupation: Disabled  Tobacco Use  . Smoking status: Former Smoker    Packs/day: 0.50    Quit date: 10/22/2015    Years since quitting: 4.3  . Smokeless tobacco: Never Used  Substance and Sexual Activity  . Alcohol use: No  . Drug use: No  . Sexual activity: Not on file  Other Topics Concern  . Not on file  Social History Narrative   Lives at home with her boyfriend.   Right-handed.   No caffeine use.   Social Determinants of Health   Financial Resource Strain:   . Difficulty of Paying Living Expenses: Not on file  Food Insecurity:   . Worried About Charity fundraiser in the Last Year: Not on file  . Ran Out of Food in the Last Year: Not on file  Transportation Needs:   . Lack of Transportation (Medical): Not on file  . Lack of Transportation (Non-Medical): Not on file  Physical Activity:   . Days of Exercise per Week: Not on file  . Minutes of Exercise per Session: Not on file  Stress:   . Feeling of Stress : Not on file  Social Connections:   . Frequency of Communication with Friends and Family: Not on file  . Frequency of Social Gatherings with Friends and Family: Not on file  .  Attends Religious Services: Not on file  . Active Member of Clubs or Organizations: Not on file  . Attends Archivist Meetings: Not on file  . Marital Status: Not on file  Intimate Partner Violence:   . Fear of Current or Ex-Partner: Not on file  . Emotionally Abused: Not on file  . Physically Abused: Not on file  . Sexually Abused: Not on file    Subjective: Review of Systems  Constitutional: Negative for chills and fever.  HENT: Negative for congestion and hearing loss.   Eyes: Negative for blurred vision and double vision.  Respiratory: Negative for cough and shortness of breath.   Cardiovascular: Negative for chest pain and palpitations.  Gastrointestinal: Positive for abdominal pain, diarrhea and nausea. Negative for blood in stool, constipation, heartburn, melena and vomiting.  Genitourinary: Negative for dysuria and urgency.  Musculoskeletal: Negative for joint pain and myalgias.  Skin: Negative for itching and rash.  Neurological: Negative for dizziness and headaches.  Psychiatric/Behavioral: Negative for depression. The patient is not nervous/anxious.        Objective: BP 134/86   Pulse (!) 101   Temp (!) 96.8 F (36 C) (Temporal)   Ht 4\' 7"  (1.397 m)   Wt 160 lb 3.2 oz (72.7 kg)   BMI 37.23 kg/m  Physical Exam Constitutional:      Appearance: Normal appearance. She is obese.  HENT:     Head: Normocephalic and atraumatic.  Eyes:     Extraocular Movements: Extraocular movements intact.     Conjunctiva/sclera: Conjunctivae normal.  Cardiovascular:     Rate and Rhythm: Normal rate and regular rhythm.  Pulmonary:     Effort: Pulmonary effort is normal.  Breath sounds: Normal breath sounds.  Abdominal:     General: Bowel sounds are normal.     Palpations: Abdomen is soft.  Musculoskeletal:        General: No swelling. Normal range of motion.     Cervical back: Normal range of motion and neck supple.  Skin:    General: Skin is warm and dry.      Coloration: Skin is not jaundiced.  Neurological:     General: No focal deficit present.     Mental Status: She is alert and oriented to person, place, and time.  Psychiatric:        Mood and Affect: Mood normal.        Behavior: Behavior normal.      Assessment: *Chronic Nausea  *Abdominal bloating *Chronic diarrhea *Abdominal pain  Plan: Etiology of patients symptoms unclear. Likely has a component of IBS given her underlying anxiety. However she will need further workup prior to establishing this diagnosis.   Will schedule for EGD to evaluate for peptic ulcer disease, esophagitis, gastritis, H. Pylori, duodenitis, or other. Will also evaluate for esophageal stricture, Schatzki's ring, esophageal web or other.   At the same time we will perform colonoscopy with random colon biopsies to rule out underlying inflammatory bowel disease such as Crohn's disease or ulcerative colitis, as well as microscopic colitis.  We will look for polyps or malignancies as well.    The risks including infection, bleed, or perforation as well as benefits, limitations, alternatives and imponderables have been reviewed with the patient. Potential for esophageal dilation, biopsy, etc. have also been reviewed.  Questions have been answered. All parties agreeable.  If endoscopic evaluation is unremarkable consider ultrasound to rule out gallbladder pathology of patient's symptoms.  Thank you Sharyn Dross for the kind referral. 02/25/2020 4:21 PM   Disclaimer: This note was dictated with voice recognition software. Similar sounding words can inadvertently be transcribed and may not be corrected upon review.

## 2020-02-25 NOTE — Patient Instructions (Signed)
We will schedule you for EGD and colonoscopy to further evaluate your symptoms.  If these are unremarkable, we will consider ultrasound to evaluate your gallbladder.  Further recommendations to follow-up  At [facility] we value your feedback. You may receive a survey about your visit today. Please share your experience as we strive to create trusting relationships with our patients to provide genuine, compassionate, quality care.  We appreciate your understanding and patience as we review any laboratory studies, imaging, and other diagnostic tests that are ordered as we care for you. Our office policy is 5 business days for review of these results, and any emergent or urgent results are addressed in a timely manner for your best interest. If you do not hear from our office in 1 week, please contact us.   We also encourage the use of MyChart, which contains your medical information for your review as well. If you are not enrolled in this feature, an access code is on this after visit summary for your convenience. Thank you for allowing [place] to be involved in your care.  It was great to see you today!  I hope you have a great rest of your fall!!    Stewart Sasaki K. Abbey Chatters, D.O. Gastroenterology and Hepatology [facility]

## 2020-02-25 NOTE — Progress Notes (Signed)
Primary Care Physician:  Wannetta Sender, FNP Primary Gastroenterologist:  Dr. Abbey Chatters  Chief Complaint  Patient presents with  . Abdominal Pain  . Nausea    no vomiting  . Diarrhea    almost daily, yellow; has a lot of stress  . Bloated    HPI:   Theresa Mullins is a 49 y.o. female who presents to the clinic today by referral from her PCP Loletha Grayer for evaluation.  Patient has multiple GI complaints for me today.  She states she has had GI issues her whole life.  She knows she has severe underlying anxiety which she attributes to her GI symptoms.  Notes chronic diarrhea for over a year.  Up to 3-4 bowel movements daily which are loose.  Sometimes she has to run to the bathroom.  Does not appear to be diet related that she can think of.  No melena or hematochezia.  No chronic NSAID use.  She had a CT scan back in 2013 that did show sigmoid colitis.  She states this was due to "a parasite."  No previous colonoscopy.  No family history of colorectal malignancy.  Also notes chronic nausea for which she takes Phenergan.  Has abdominal bloating as well.  Previously had reflux which she states has been maintained just with a better diet.  No previous EGD.  Denies any dysphagia or odynophagia.  Past Medical History:  Diagnosis Date  . ADD (attention deficit disorder)   . Anxiety   . Anxiety   . Carpal tunnel syndrome   . Cerebellar bleed ([place])   . Depression   . DUB (dysfunctional uterine bleeding)   . Dysmenorrhea   . High cholesterol   . Hip pain   . Hypertension   . Joint pain   . Mood disorder ([place])   . Restless legs   . Vertigo     Past Surgical History:  Procedure Laterality Date  . BRAIN SURGERY      Current Outpatient Medications  Medication Sig Dispense Refill  . acetaminophen (TYLENOL) 500 MG tablet Take 1,500 mg by mouth daily as needed for headache.    . ALPRAZolam (XANAX) 0.5 MG tablet Take 0.5 mg by mouth 2 (two) times daily.    .  butalbital-acetaminophen-caffeine (FIORICET, ESGIC) 50-325-40 MG tablet Take 1 tablet by mouth every 6 (six) hours as needed for headache. 10 tablet 3  . cetirizine (ZYRTEC) 10 MG chewable tablet Chew 10 mg by mouth daily.    . fluticasone (FLONASE) 50 MCG/ACT nasal spray Place 1 spray into both nostrils daily.    Marland Kitchen gabapentin (NEURONTIN) 100 MG capsule Take 100 mg by mouth 2 (two) times daily.   0  . Gluc-Chonn-MSM-Boswellia-Vit D (GLUCOSAMINE CHONDROITIN + D3 PO) Take 1 tablet by mouth 2 (two) times daily.    Marland Kitchen lisinopril (PRINIVIL,ZESTRIL) 10 MG tablet Take 10 mg by mouth daily.    . meclizine (ANTIVERT) 25 MG tablet Take 25 mg by mouth as needed.     . Nutritional Supplements (ESTROVEN PO) Take 1 tablet by mouth daily.    . Potassium 99 MG TABS Take 1 tablet by mouth daily.    . promethazine (PHENERGAN) 25 MG tablet Take 25 mg by mouth as needed.   0  . traZODone (DESYREL) 100 MG tablet Take 100 mg by mouth at bedtime.    Marland Kitchen venlafaxine (EFFEXOR) 75 MG tablet Take 75 mg by mouth daily. Takes in morning    . venlafaxine XR (EFFEXOR-XR)  37.5 MG 24 hr capsule Take 37.5 mg by mouth at bedtime.     . vitamin B-12 (CYANOCOBALAMIN) 1000 MCG tablet Take 1,000 mcg by mouth daily.    Marland Kitchen ALPRAZolam (XANAX) 1 MG tablet Take 1 mg by mouth 4 (four) times daily. (Patient not taking: Reported on 02/25/2020)    . clonazePAM (KLONOPIN) 0.5 MG tablet  (Patient not taking: Reported on 02/25/2020)  1  . diazepam (VALIUM) 10 MG tablet Take 5-10 mg by mouth every 8 (eight) hours as needed for anxiety.  (Patient not taking: Reported on 02/25/2020)    . divalproex (DEPAKOTE ER) 500 MG 24 hr tablet Take 1 tablet (500 mg total) by mouth at bedtime. (Patient not taking: Reported on 06/19/2018) 30 tablet 11  . doxycycline (VIBRA-TABS) 100 MG tablet TAKE ONE (1) CAPSULE EACH DAY (Patient not taking: Reported on 02/25/2020) 30 tablet 0  . ferrous sulfate 325 (65 FE) MG tablet Take 325 mg by mouth at bedtime. (Patient not taking:  Reported on 02/25/2020)    . furosemide (LASIX) 20 MG tablet TAKE ONE (1) TABLET EACH DAY (Patient not taking: Reported on 06/19/2018) 30 tablet 6  . gemfibrozil (LOPID) 600 MG tablet Take 600 mg by mouth 2 (two) times daily.  (Patient not taking: Reported on 02/25/2020)    . HYDROcodone-acetaminophen (NORCO) 10-325 MG tablet Take 1 tablet by mouth every 8 (eight) hours as needed for moderate pain or severe pain.  (Patient not taking: Reported on 02/25/2020)  0  . ibuprofen (ADVIL,MOTRIN) 200 MG tablet Take 200 mg by mouth every 6 (six) hours as needed for mild pain or moderate pain. (Patient not taking: Reported on 02/25/2020)    . ibuprofen (ADVIL,MOTRIN) 800 MG tablet Take 800 mg by mouth 3 (three) times daily. (Patient not taking: Reported on 02/25/2020)    . lamoTRIgine (LAMICTAL) 200 MG tablet Take 200 mg by mouth 2 (two) times daily. For mood (Patient not taking: Reported on 02/25/2020)    . methylphenidate (RITALIN) 10 MG tablet Take 10 mg by mouth 2 (two) times daily. (Patient not taking: Reported on 02/25/2020)    . Multiple Vitamin (MULTIVITAMIN) tablet Take 1 tablet by mouth 2 (two) times daily.  (Patient not taking: Reported on 02/25/2020)    . nortriptyline (PAMELOR) 25 MG capsule Take 25 mg by mouth 3 (three) times daily. (Patient not taking: Reported on 02/25/2020)    . ondansetron (ZOFRAN ODT) 4 MG disintegrating tablet Take 1 tablet (4 mg total) by mouth every 8 (eight) hours as needed for nausea or vomiting. (Patient not taking: Reported on 06/19/2018) 20 tablet 0  . oxyCODONE-acetaminophen (PERCOCET/ROXICET) 5-325 MG tablet Take by mouth every 4 (four) hours as needed for severe pain. (Patient not taking: Reported on 02/25/2020)    . sertraline (ZOLOFT) 50 MG tablet Take 50 mg by mouth 2 (two) times daily.  (Patient not taking: Reported on 02/25/2020)    . traZODone (DESYREL) 50 MG tablet Take 50 mg by mouth at bedtime. (Patient not taking: Reported on 02/25/2020)     No current  facility-administered medications for this visit.    Allergies as of 02/25/2020 - Review Complete 02/25/2020  Allergen Reaction Noted  . Ciprofloxacin Anaphylaxis 10/15/2013  . Depakote [divalproex sodium]  02/25/2020  . Remeron [mirtazapine]  03/20/2012  . Contrast media [iodinated diagnostic agents] Rash 01/02/2016    Family History  Problem Relation Age of Onset  . Mental illness Mother   . Hypertension Mother   . Depression Mother   .  CAD Mother   . Diabetes Mother   . Melanoma Mother   . Mental illness Father   . Heart attack Father   . Aneurysm Father     Social History   Socioeconomic History  . Marital status: Single    Spouse name: Not on file  . Number of children: 0  . Years of education: GED  . Highest education level: Not on file  Occupational History  . Occupation: Disabled  Tobacco Use  . Smoking status: Former Smoker    Packs/day: 0.50    Quit date: 10/22/2015    Years since quitting: 4.3  . Smokeless tobacco: Never Used  Substance and Sexual Activity  . Alcohol use: No  . Drug use: No  . Sexual activity: Not on file  Other Topics Concern  . Not on file  Social History Narrative   Lives at home with her boyfriend.   Right-handed.   No caffeine use.   Social Determinants of Health   Financial Resource Strain:   . Difficulty of Paying Living Expenses: Not on file  Food Insecurity:   . Worried About Charity fundraiser in the Last Year: Not on file  . Ran Out of Food in the Last Year: Not on file  Transportation Needs:   . Lack of Transportation (Medical): Not on file  . Lack of Transportation (Non-Medical): Not on file  Physical Activity:   . Days of Exercise per Week: Not on file  . Minutes of Exercise per Session: Not on file  Stress:   . Feeling of Stress : Not on file  Social Connections:   . Frequency of Communication with Friends and Family: Not on file  . Frequency of Social Gatherings with Friends and Family: Not on file  .  Attends Religious Services: Not on file  . Active Member of Clubs or Organizations: Not on file  . Attends Archivist Meetings: Not on file  . Marital Status: Not on file  Intimate Partner Violence:   . Fear of Current or Ex-Partner: Not on file  . Emotionally Abused: Not on file  . Physically Abused: Not on file  . Sexually Abused: Not on file    Subjective: Review of Systems  Constitutional: Negative for chills and fever.  HENT: Negative for congestion and hearing loss.   Eyes: Negative for blurred vision and double vision.  Respiratory: Negative for cough and shortness of breath.   Cardiovascular: Negative for chest pain and palpitations.  Gastrointestinal: Positive for abdominal pain, diarrhea and nausea. Negative for blood in stool, constipation, heartburn, melena and vomiting.  Genitourinary: Negative for dysuria and urgency.  Musculoskeletal: Negative for joint pain and myalgias.  Skin: Negative for itching and rash.  Neurological: Negative for dizziness and headaches.  Psychiatric/Behavioral: Negative for depression. The patient is not nervous/anxious.        Objective: BP 134/86   Pulse (!) 101   Temp (!) 96.8 F (36 C) (Temporal)   Ht 4\' 7"  (1.397 m)   Wt 160 lb 3.2 oz (72.7 kg)   BMI 37.23 kg/m  Physical Exam Constitutional:      Appearance: Normal appearance. She is obese.  HENT:     Head: Normocephalic and atraumatic.  Eyes:     Extraocular Movements: Extraocular movements intact.     Conjunctiva/sclera: Conjunctivae normal.  Cardiovascular:     Rate and Rhythm: Normal rate and regular rhythm.  Pulmonary:     Effort: Pulmonary effort is normal.  Breath sounds: Normal breath sounds.  Abdominal:     General: Bowel sounds are normal.     Palpations: Abdomen is soft.  Musculoskeletal:        General: No swelling. Normal range of motion.     Cervical back: Normal range of motion and neck supple.  Skin:    General: Skin is warm and dry.      Coloration: Skin is not jaundiced.  Neurological:     General: No focal deficit present.     Mental Status: She is alert and oriented to person, place, and time.  Psychiatric:        Mood and Affect: Mood normal.        Behavior: Behavior normal.      Assessment: *Chronic Nausea  *Abdominal bloating *Chronic diarrhea *Abdominal pain  Plan: Etiology of patients symptoms unclear. Likely has a component of IBS given her underlying anxiety. However she will need further workup prior to establishing this diagnosis.   Will schedule for EGD to evaluate for peptic ulcer disease, esophagitis, gastritis, H. Pylori, duodenitis, or other. Will also evaluate for esophageal stricture, Schatzki's ring, esophageal web or other.   At the same time we will perform colonoscopy with random colon biopsies to rule out underlying inflammatory bowel disease such as Crohn's disease or ulcerative colitis, as well as microscopic colitis.  We will look for polyps or malignancies as well.    The risks including infection, bleed, or perforation as well as benefits, limitations, alternatives and imponderables have been reviewed with the patient. Potential for esophageal dilation, biopsy, etc. have also been reviewed.  Questions have been answered. All parties agreeable.  If endoscopic evaluation is unremarkable consider ultrasound to rule out gallbladder pathology of patient's symptoms.  Thank you Sharyn Dross for the kind referral. 02/25/2020 4:21 PM   Disclaimer: This note was dictated with voice recognition software. Similar sounding words can inadvertently be transcribed and may not be corrected upon review.

## 2020-02-26 ENCOUNTER — Other Ambulatory Visit: Payer: Self-pay

## 2020-02-26 NOTE — Patient Instructions (Signed)
PA for EGD submitted via [facility] website. PA# [id number], valid 03/21/20-04/22/20. No PA needed for [facility] Medicare).

## 2020-03-15 ENCOUNTER — Telehealth: Payer: Self-pay | Admitting: *Deleted

## 2020-03-15 NOTE — Telephone Encounter (Signed)
Patient returned call. She states she has to wait for her mom's caregiver to arrive before she could leave to go to her appt for procedure Monday. She states the soonest she probably could be there is 12:30pm. I advised if she can get there sooner then go ahead. She voiced understanding. Endo aware. Pt also aware npo midnight clear liquids.

## 2020-03-15 NOTE — Telephone Encounter (Signed)
LMOVM for pt see if she can move up on schedule for monday

## 2020-03-16 ENCOUNTER — Other Ambulatory Visit: Payer: Self-pay

## 2020-03-16 ENCOUNTER — Other Ambulatory Visit (HOSPITAL_COMMUNITY)
Admission: RE | Admit: 2020-03-16 | Discharge: 2020-03-16 | Disposition: A | Discharge status: Home / Self Care | Payer: Medicare Other | Source: Ambulatory Visit | Attending: Internal Medicine | Admitting: Internal Medicine

## 2020-03-16 DIAGNOSIS — Z20822 Contact with and (suspected) exposure to covid-19: Secondary | ICD-10-CM | POA: Diagnosis not present

## 2020-03-16 DIAGNOSIS — Z01812 Encounter for preprocedural laboratory examination: Secondary | ICD-10-CM | POA: Insufficient documentation

## 2020-03-16 LAB — SARS CORONAVIRUS 2 (TAT 6-24 HRS): SARS Coronavirus 2: NEGATIVE

## 2020-03-21 ENCOUNTER — Encounter (HOSPITAL_COMMUNITY)
Admission: RE | Disposition: A | Discharge status: Home / Self Care | Payer: Self-pay | Source: Home / Self Care | Attending: Internal Medicine

## 2020-03-21 ENCOUNTER — Encounter (HOSPITAL_COMMUNITY): Payer: Self-pay

## 2020-03-21 ENCOUNTER — Ambulatory Visit (HOSPITAL_COMMUNITY): Payer: Medicare Other | Admitting: Anesthesiology

## 2020-03-21 ENCOUNTER — Other Ambulatory Visit: Payer: Self-pay

## 2020-03-21 ENCOUNTER — Ambulatory Visit (HOSPITAL_COMMUNITY)
Admission: RE | Admit: 2020-03-21 | Discharge: 2020-03-21 | Disposition: A | Discharge status: Home / Self Care | Payer: Medicare Other | Attending: Internal Medicine | Admitting: Internal Medicine

## 2020-03-21 DIAGNOSIS — Z87891 Personal history of nicotine dependence: Secondary | ICD-10-CM | POA: Diagnosis not present

## 2020-03-21 DIAGNOSIS — F419 Anxiety disorder, unspecified: Secondary | ICD-10-CM | POA: Insufficient documentation

## 2020-03-21 DIAGNOSIS — Z79899 Other long term (current) drug therapy: Secondary | ICD-10-CM | POA: Insufficient documentation

## 2020-03-21 DIAGNOSIS — D122 Benign neoplasm of ascending colon: Secondary | ICD-10-CM | POA: Insufficient documentation

## 2020-03-21 DIAGNOSIS — K635 Polyp of colon: Secondary | ICD-10-CM

## 2020-03-21 DIAGNOSIS — K319 Disease of stomach and duodenum, unspecified: Secondary | ICD-10-CM | POA: Insufficient documentation

## 2020-03-21 DIAGNOSIS — K648 Other hemorrhoids: Secondary | ICD-10-CM | POA: Diagnosis not present

## 2020-03-21 DIAGNOSIS — K529 Noninfective gastroenteritis and colitis, unspecified: Secondary | ICD-10-CM

## 2020-03-21 DIAGNOSIS — K2970 Gastritis, unspecified, without bleeding: Secondary | ICD-10-CM

## 2020-03-21 DIAGNOSIS — K5730 Diverticulosis of large intestine without perforation or abscess without bleeding: Secondary | ICD-10-CM | POA: Diagnosis not present

## 2020-03-21 DIAGNOSIS — R1030 Lower abdominal pain, unspecified: Secondary | ICD-10-CM | POA: Diagnosis present

## 2020-03-21 HISTORY — PX: POLYPECTOMY: SHX5525

## 2020-03-21 HISTORY — PX: ESOPHAGOGASTRODUODENOSCOPY (EGD) WITH PROPOFOL: SHX5813

## 2020-03-21 HISTORY — PX: COLONOSCOPY WITH PROPOFOL: SHX5780

## 2020-03-21 HISTORY — PX: BIOPSY: SHX5522

## 2020-03-21 LAB — PREGNANCY, URINE: Preg Test, Ur: NEGATIVE

## 2020-03-21 SURGERY — COLONOSCOPY WITH PROPOFOL
Anesthesia: General

## 2020-03-21 MED ORDER — PROPOFOL 10 MG/ML IV BOLUS
INTRAVENOUS | Status: DC | PRN
  Administered 2020-03-21: 20 mg via INTRAVENOUS
  Administered 2020-03-21: 100 mg via INTRAVENOUS
  Administered 2020-03-21: 20 mg via INTRAVENOUS

## 2020-03-21 MED ORDER — STERILE WATER FOR IRRIGATION IR SOLN
Status: DC | PRN
  Administered 2020-03-21: 1.5 mL

## 2020-03-21 MED ORDER — PROPOFOL 500 MG/50ML IV EMUL
INTRAVENOUS | Status: DC | PRN
  Administered 2020-03-21 (×2): 150 ug/kg/min via INTRAVENOUS

## 2020-03-21 MED ORDER — LIDOCAINE HCL (CARDIAC) PF 50 MG/5ML IV SOSY
PREFILLED_SYRINGE | INTRAVENOUS | Status: DC | PRN
  Administered 2020-03-21: 100 mg via INTRAVENOUS

## 2020-03-21 MED ORDER — LACTATED RINGERS IV SOLN: INTRAVENOUS | Status: DC

## 2020-03-21 MED ORDER — OMEPRAZOLE 20 MG PO CPDR: 20.0000 mg | DELAYED_RELEASE_CAPSULE | Freq: Two times a day (BID) | ORAL | 5 refills | Status: DC

## 2020-03-21 NOTE — Transfer of Care (Signed)
Immediate Anesthesia Transfer of Care Note  Patient: Theresa Mullins  Procedure(s) Performed: COLONOSCOPY WITH PROPOFOL (N/A ) ESOPHAGOGASTRODUODENOSCOPY (EGD) WITH PROPOFOL (N/A ) BIOPSY POLYPECTOMY  Patient Location: Endoscopy Unit  Anesthesia Type:General  Level of Consciousness: awake, alert  and patient cooperative  Airway & Oxygen Therapy: Patient Spontanous Breathing  Post-op Assessment: Report given to RN and Post -op Vital signs reviewed and stable  Post vital signs: Reviewed and stable  Last Vitals:  Vitals Value Taken Time  BP    Temp    Pulse    Resp    SpO2      Last Pain:  Vitals:   03/21/20 1306  TempSrc:   PainSc: 0-No pain      Patients Stated Pain Goal: 7 (70/01/74 9449)  Complications: No complications documented.

## 2020-03-21 NOTE — Anesthesia Procedure Notes (Addendum)
Date/Time: 03/21/2020 1:04 PM Performed by: Vista Deck, CRNA Pre-anesthesia Checklist: Patient identified, Emergency Drugs available, Suction available, Timeout performed and Patient being monitored Patient Re-evaluated:Patient Re-evaluated prior to induction Oxygen Delivery Method: Nasal Cannula

## 2020-03-21 NOTE — Op Note (Signed)
[facility] Patient Name: Theresa Mullins Procedure Date: 03/21/2020 11:42 AM MRN: [id number] Date of Birth: 09/04/1970 Attending MD: Elon Alas. Abbey Chatters DO CSN: [id number] Age: 49 Admit Type: Outpatient Procedure:                Upper GI endoscopy Indications:              Epigastric abdominal pain, Diarrhea, Nausea with                            vomiting Providers:                Elon Alas. Abbey Chatters, DO, Caprice Kluver, Casimer Bilis, Technician Referring MD:              Medicines:                See the Anesthesia note for documentation of the                            administered medications Complications:            No immediate complications. Estimated Blood Loss:     Estimated blood loss was minimal. Procedure:                Pre-Anesthesia Assessment:                           - The anesthesia plan was to use monitored                            anesthesia care (MAC).                           After obtaining informed consent, the endoscope was                            passed under direct vision. Throughout the                            procedure, the patient's blood pressure, pulse, and                            oxygen saturations were monitored continuously. The                            [id number]) was introduced through the mouth,                            and advanced to the second part of duodenum. The                            upper GI endoscopy was accomplished without                            difficulty. The patient tolerated the procedure  well. Scope In: 1:12:02 PM Scope Out: 1:15:22 PM Total Procedure Duration: 0 hours 3 minutes 20 seconds  Findings:      The Z-line was regular and was found 33 cm from the incisors.      Biopsies were taken with a cold forceps in the middle third of the       esophagus for histology.      Localized moderate inflammation characterized by erosions and  erythema       was found in the gastric antrum. Biopsies were taken with a cold forceps       for Helicobacter pylori testing.      The duodenal bulb, first portion of the duodenum and second portion of       the duodenum were normal. Biopsies for histology were taken with a cold       forceps for evaluation of celiac disease. Impression:               - Z-line regular, 33 cm from the incisors.                           - Gastritis. Biopsied.                           - Normal duodenal bulb, first portion of the                            duodenum and second portion of the duodenum.                            Biopsied.                           - Biopsies were taken with a cold forceps for                            histology in the middle third of the esophagus. Moderate Sedation:      Per Anesthesia Care Recommendation:           - Patient has a contact number available for                            emergencies. The signs and symptoms of potential                            delayed complications were discussed with the                            patient. Return to normal activities tomorrow.                            Written discharge instructions were provided to the                            patient.                           - Resume previous diet.                           -  Continue present medications.                           - Await pathology results.                           - Return to GI clinic in 3 weeks.                           - Use Prilosec (omeprazole) 20 mg PO BID for 8                            weeks. Procedure Code(s):        --- Professional ---                           [phone], Esophagogastroduodenoscopy, flexible,                            transoral; with biopsy, single or multiple Diagnosis Code(s):        --- Professional ---                           K29.70, Gastritis, unspecified, without bleeding                           R10.13, Epigastric pain                            R19.7, Diarrhea, unspecified                           R11.2, Nausea with vomiting, unspecified CPT copyright 2019 American Medical Association. All rights reserved. The codes documented in this report are preliminary and upon coder review may  be revised to meet current compliance requirements. Elon Alas. Abbey Chatters, DO [place] Abbey Chatters, DO 03/21/2020 1:18:54 PM This report has been signed electronically. Number of Addenda: 0

## 2020-03-21 NOTE — Anesthesia Postprocedure Evaluation (Signed)
Anesthesia Post Note  Patient: Theresa Mullins  Procedure(s) Performed: COLONOSCOPY WITH PROPOFOL (N/A ) ESOPHAGOGASTRODUODENOSCOPY (EGD) WITH PROPOFOL (N/A ) BIOPSY POLYPECTOMY  Patient location during evaluation: Endoscopy Anesthesia Type: General Level of consciousness: awake and alert and patient cooperative Pain management: satisfactory to patient Vital Signs Assessment: post-procedure vital signs reviewed and stable Respiratory status: spontaneous breathing Cardiovascular status: stable Postop Assessment: no apparent nausea or vomiting Anesthetic complications: no   No complications documented.   Last Vitals:  Vitals:   03/21/20 1214  BP: 112/76  Resp: 12  Temp: 37.3 C  SpO2: 100%    Last Pain:  Vitals:   03/21/20 1306  TempSrc:   PainSc: 0-No pain                 Ranessa Kosta

## 2020-03-21 NOTE — Interval H&P Note (Signed)
History and Physical Interval Note:  03/21/2020 12:30 PM  Theresa Mullins  has presented today for surgery, with the diagnosis of diarrhea, nausea, periumbilical abdominal pain, bloating.  The various methods of treatment have been discussed with the patient and family. After consideration of risks, benefits and other options for treatment, the patient has consented to  Procedure(s) with comments: COLONOSCOPY WITH PROPOFOL (N/A) - Patient CANNOT come any earlier. ESOPHAGOGASTRODUODENOSCOPY (EGD) WITH PROPOFOL (N/A) as a surgical intervention.  The patient's history has been reviewed, patient examined, no change in status, stable for surgery.  I have reviewed the patient's chart and labs.  Questions were answered to the patient's satisfaction.     Eloise Harman

## 2020-03-21 NOTE — Discharge Instructions (Addendum)
EGD Discharge instructions Please read the instructions outlined below and refer to this sheet in the next few weeks. These discharge instructions provide you with general information on caring for yourself after you leave the hospital. Your doctor may also give you specific instructions. While your treatment has been planned according to the most current medical practices available, unavoidable complications occasionally occur. If you have any problems or questions after discharge, please call your doctor. ACTIVITY  You may resume your regular activity but move at a slower pace for the next 24 hours.   Take frequent rest periods for the next 24 hours.   Walking will help expel (get rid of) the air and reduce the bloated feeling in your abdomen.   No driving for 24 hours (because of the anesthesia (medicine) used during the test).   You may shower.   Do not sign any important legal documents or operate any machinery for 24 hours (because of the anesthesia used during the test).  NUTRITION  Drink plenty of fluids.   You may resume your normal diet.   Begin with a light meal and progress to your normal diet.   Avoid alcoholic beverages for 24 hours or as instructed by your caregiver.  MEDICATIONS  You may resume your normal medications unless your caregiver tells you otherwise.  WHAT YOU CAN EXPECT TODAY  You may experience abdominal discomfort such as a feeling of fullness or "gas" pains.  FOLLOW-UP  Your doctor will discuss the results of your test with you.  SEEK IMMEDIATE MEDICAL ATTENTION IF ANY OF THE FOLLOWING OCCUR:  Excessive nausea (feeling sick to your stomach) and/or vomiting.   Severe abdominal pain and distention (swelling).   Trouble swallowing.   Temperature over 101 F (37.8 C).   Rectal bleeding or vomiting of blood.    Gastritis, Adult  Gastritis is swelling (inflammation) of the stomach. Gastritis can develop quickly (acute). It can also develop  slowly over time (chronic). It is important to get help for this condition. If you do not get help, your stomach can bleed, and you can get sores (ulcers) in your stomach. What are the causes? This condition may be caused by:  Germs that get to your stomach.  Drinking too much alcohol.  Medicines you are taking.  Too much acid in the stomach.  A disease of the intestines or stomach.  Stress.  An allergic reaction.  Crohn's disease.  Some cancer treatments (radiation). Sometimes the cause of this condition is not known. What are the signs or symptoms? Symptoms of this condition include:  Pain in your stomach.  A burning feeling in your stomach.  Feeling sick to your stomach (nauseous).  Throwing up (vomiting).  Feeling too full after you eat.  Weight loss.  Bad breath.  Throwing up blood.  Blood in your poop (stool). How is this diagnosed? This condition may be diagnosed with:  Your medical history and symptoms.  A physical exam.  Tests. These can include: ? Blood tests. ? Stool tests. ? A procedure to look inside your stomach (upper endoscopy). ? A test in which a sample of tissue is taken for testing (biopsy). How is this treated? Treatment for this condition depends on what caused it. You may be given:  Antibiotic medicine, if your condition was caused by germs.  H2 blockers and similar medicines, if your condition was caused by too much acid. Follow these instructions at home: Medicines  Take over-the-counter and prescription medicines only as told by  your doctor.  If you were prescribed an antibiotic medicine, take it as told by your doctor. Do not stop taking it even if you start to feel better. Eating and drinking   Eat small meals often, instead of large meals.  Avoid foods and drinks that make your symptoms worse.  Drink enough fluid to keep your pee (urine) pale yellow. Alcohol use  Do not drink alcohol if: ? Your doctor tells you  not to drink. ? You are pregnant, may be pregnant, or are planning to become pregnant.  If you drink alcohol: ? Limit your use to:  0-1 drink a day for women.  0-2 drinks a day for men. ? Be aware of how much alcohol is in your drink. In the [place] one drink equals one 12 oz bottle of beer (355 mL), one 5 oz glass of wine (148 mL), or one 1 oz glass of hard liquor (44 mL). General instructions  Talk with your doctor about ways to manage stress. You can exercise or do deep breathing, meditation, or yoga.  Do not smoke or use products that have nicotine or tobacco. If you need help quitting, ask your doctor.  Keep all follow-up visits as told by your doctor. This is important. Contact a doctor if:  Your symptoms get worse.  Your symptoms go away and then come back. Get help right away if:  You throw up blood or something that looks like coffee grounds.  You have black or dark red poop.  You throw up any time you try to drink fluids.  Your stomach pain gets worse.  You have a fever.  You do not feel better after one week. Summary  Gastritis is swelling (inflammation) of the stomach.  You must get help for this condition. If you do not get help, your stomach can bleed, and you can get sores (ulcers).  This condition is diagnosed with medical history, physical exam, or tests.  You can be treated with medicines for germs or medicines to block too much acid in your stomach. This information is not intended to replace advice given to you by your health care provider. Make sure you discuss any questions you have with your health care provider. Document Revised: 08/27/2017 Document Reviewed: 08/27/2017 Elsevier Patient Education  [place].     Colonoscopy Discharge Instructions  Read the instructions outlined below and refer to this sheet in the next few weeks. These discharge instructions provide you with general information on caring for yourself after you  leave the hospital. Your doctor may also give you specific instructions. While your treatment has been planned according to the most current medical practices available, unavoidable complications occasionally occur.   ACTIVITY  You may resume your regular activity, but move at a slower pace for the next 24 hours.   Take frequent rest periods for the next 24 hours.   Walking will help get rid of the air and reduce the bloated feeling in your belly (abdomen).   No driving for 24 hours (because of the medicine (anesthesia) used during the test).    Do not sign any important legal documents or operate any machinery for 24 hours (because of the anesthesia used during the test).  NUTRITION  Drink plenty of fluids.   You may resume your normal diet as instructed by your doctor.   Begin with a light meal and progress to your normal diet. Heavy or fried foods are harder to digest and may make you feel  sick to your stomach (nauseated).   Avoid alcoholic beverages for 24 hours or as instructed.  MEDICATIONS  You may resume your normal medications unless your doctor tells you otherwise.  WHAT YOU CAN EXPECT TODAY  Some feelings of bloating in the abdomen.   Passage of more gas than usual.   Spotting of blood in your stool or on the toilet paper.  IF YOU HAD POLYPS REMOVED DURING THE COLONOSCOPY:  No aspirin products for 7 days or as instructed.   No alcohol for 7 days or as instructed.   Eat a soft diet for the next 24 hours.  FINDING OUT THE RESULTS OF YOUR TEST Not all test results are available during your visit. If your test results are not back during the visit, make an appointment with your caregiver to find out the results. Do not assume everything is normal if you have not heard from your caregiver or the medical facility. It is important for you to follow up on all of your test results.  SEEK IMMEDIATE MEDICAL ATTENTION IF:  You have more than a spotting of blood in your  stool.   Your belly is swollen (abdominal distention).   You are nauseated or vomiting.   You have a temperature over 101.   You have abdominal pain or discomfort that is severe or gets worse throughout the day.    Colon Polyps  Polyps are tissue growths inside the body. Polyps can grow in many places, including the large intestine (colon). A polyp may be a round bump or a mushroom-shaped growth. You could have one polyp or several. Most colon polyps are noncancerous (benign). However, some colon polyps can become cancerous over time. Finding and removing the polyps early can help prevent this. What are the causes? The exact cause of colon polyps is not known. What increases the risk? You are more likely to develop this condition if you:  Have a family history of colon cancer or colon polyps.  Are older than 5 or older than 45 if you are African American.  Have inflammatory bowel disease, such as ulcerative colitis or Crohn's disease.  Have certain hereditary conditions, such as: ? Familial adenomatous polyposis. ? Lynch syndrome. ? Turcot syndrome. ? Peutz-Jeghers syndrome.  Are overweight.  Smoke cigarettes.  Do not get enough exercise.  Drink too much alcohol.  Eat a diet that is high in fat and red meat and low in fiber.  Had childhood cancer that was treated with abdominal radiation. What are the signs or symptoms? Most polyps do not cause symptoms. If you have symptoms, they may include:  Blood coming from your rectum when having a bowel movement.  Blood in your stool. The stool may look dark red or black.  Abdominal pain.  A change in bowel habits, such as constipation or diarrhea. How is this diagnosed? This condition is diagnosed with a colonoscopy. This is a procedure in which a lighted, flexible scope is inserted into the anus and then passed into the colon to examine the area. Polyps are sometimes found when a colonoscopy is done as part of routine  cancer screening tests. How is this treated? Treatment for this condition involves removing any polyps that are found. Most polyps can be removed during a colonoscopy. Those polyps will then be tested for cancer. Additional treatment may be needed depending on the results of testing. Follow these instructions at home: Lifestyle  Maintain a healthy weight, or lose weight if recommended by your health  care provider.  Exercise every day or as told by your health care provider.  Do not use any products that contain nicotine or tobacco, such as cigarettes and e-cigarettes. If you need help quitting, ask your health care provider.  If you drink alcohol, limit how much you have: ? 0-1 drink a day for women. ? 0-2 drinks a day for men.  Be aware of how much alcohol is in your drink. In the [place] one drink equals one 12 oz bottle of beer (355 mL), one 5 oz glass of wine (148 mL), or one 1 oz shot of hard liquor (44 mL). Eating and drinking   Eat foods that are high in fiber, such as fruits, vegetables, and whole grains.  Eat foods that are high in calcium and vitamin D, such as milk, cheese, yogurt, eggs, liver, fish, and broccoli.  Limit foods that are high in fat, such as fried foods and desserts.  Limit the amount of red meat and processed meat you eat, such as hot dogs, sausage, bacon, and lunch meats. General instructions  Keep all follow-up visits as told by your health care provider. This is important. ? This includes having regularly scheduled colonoscopies. ? Talk to your health care provider about when you need a colonoscopy. Contact a health care provider if:  You have new or worsening bleeding during a bowel movement.  You have new or increased blood in your stool.  You have a change in bowel habits.  You lose weight for no known reason. Summary  Polyps are tissue growths inside the body. Polyps can grow in many places, including the colon.  Most colon polyps are  noncancerous (benign), but some can become cancerous over time.  This condition is diagnosed with a colonoscopy.  Treatment for this condition involves removing any polyps that are found. Most polyps can be removed during a colonoscopy. This information is not intended to replace advice given to you by your health care provider. Make sure you discuss any questions you have with your health care provider. Document Revised: 07/25/2017 Document Reviewed: 07/25/2017 Elsevier Patient Education  [place] EGD showed a moderate amount of inflammation in your stomach.  I biopsied this to rule out infection with a bacteria called H. pylori.  I also biopsied your small bowel to rule out celiac disease and your esophagus to rule out underlying inflammation.  Await pathology results, my office will contact you.  I am going to start you on omeprazole 20 mg twice daily for the next 8 weeks.  You can decrease down to once daily thereafter.  Your colonoscopy revealed 2 polyp(s) which I removed successfully. Await pathology results, my office will contact you. I recommend repeating colonoscopy in 5 years for surveillance purposes.   I also took biopsies of your colon as well to rule out microscopic colitis which can cause chronic diarrhea.  Follow-up with GI in 2 to 3 months.   I hope you have a great rest of your week!  Elon Alas. Abbey Chatters, D.O. Gastroenterology and Hepatology [facility]

## 2020-03-21 NOTE — Anesthesia Preprocedure Evaluation (Signed)
Anesthesia Evaluation  Patient identified by MRN, date of birth, ID band Patient awake    Reviewed: Allergy & Precautions, H&P , NPO status , Patient's Chart, lab work & pertinent test results, reviewed documented beta blocker date and time   Airway Mallampati: II  TM Distance: >3 FB Neck ROM: full    Dental no notable dental hx.    Pulmonary neg pulmonary ROS, former smoker,    Pulmonary exam normal breath sounds clear to auscultation       Cardiovascular Exercise Tolerance: Good hypertension, negative cardio ROS   Rhythm:regular Rate:Normal     Neuro/Psych PSYCHIATRIC DISORDERS Anxiety Depression  Neuromuscular disease    GI/Hepatic negative GI ROS, Neg liver ROS,   Endo/Other  negative endocrine ROS  Renal/GU negative Renal ROS  negative genitourinary   Musculoskeletal   Abdominal   Peds  Hematology negative hematology ROS (+)   Anesthesia Other Findings   Reproductive/Obstetrics negative OB ROS                             Anesthesia Physical Anesthesia Plan  ASA: III  Anesthesia Plan: General   Post-op Pain Management:    Induction:   PONV Risk Score and Plan: Propofol infusion  Airway Management Planned:   Additional Equipment:   Intra-op Plan:   Post-operative Plan:   Informed Consent: I have reviewed the patients History and Physical, chart, labs and discussed the procedure including the risks, benefits and alternatives for the proposed anesthesia with the patient or authorized representative who has indicated his/her understanding and acceptance.     Dental Advisory Given  Plan Discussed with: CRNA  Anesthesia Plan Comments:         Anesthesia Quick Evaluation

## 2020-03-21 NOTE — Op Note (Signed)
[facility] Patient Name: Theresa Mullins Procedure Date: 03/21/2020 1:17 PM MRN: [id number] Date of Birth: 1970/05/07 Attending MD: Elon Alas. Abbey Chatters DO CSN: [id number] Age: 49 Admit Type: Outpatient Procedure:                Colonoscopy Indications:              Lower abdominal pain, Chronic diarrhea Providers:                Elon Alas. Abbey Chatters, DO, Caprice Kluver, Casimer Bilis, Technician Referring MD:              Medicines:                See the Anesthesia note for documentation of the                            administered medications Complications:            No immediate complications. Estimated Blood Loss:     Estimated blood loss was minimal. Procedure:                Pre-Anesthesia Assessment:                           - The anesthesia plan was to use monitored                            anesthesia care (MAC).                           After obtaining informed consent, the colonoscope                            was passed under direct vision. Throughout the                            procedure, the patient's blood pressure, pulse, and                            oxygen saturations were monitored continuously. The                            [id number]-[id number] ([id number]) scope was introduced through                            the anus and advanced to the the cecum, identified                            by appendiceal orifice and ileocecal valve. The                            colonoscopy was performed without difficulty. The                            patient tolerated the procedure well. The  quality                            of the bowel preparation was evaluated using the                            BBPS [facility] Bowel Preparation Scale) with scores                            of: Right Colon = 3, Transverse Colon = 3 and Left                            Colon = 3 (entire mucosa seen well with no residual                            staining, small  fragments of stool or opaque                            liquid). The total BBPS score equals 9. Scope In: 1:20:05 PM Scope Out: 1:38:02 PM Scope Withdrawal Time: 0 hours 14 minutes 27 seconds  Total Procedure Duration: 0 hours 17 minutes 57 seconds  Findings:      The perianal and digital rectal examinations were normal.      Non-bleeding internal hemorrhoids were found during endoscopy.      Multiple small-mouthed diverticula were found in the sigmoid colon.      Two sessile polyps were found in the ascending colon. The polyps were 4       to 6 mm in size. These polyps were removed with a cold snare. Resection       and retrieval were complete.      Biopsies for histology were taken with a cold forceps from the ascending       colon, transverse colon and descending colon for evaluation of       microscopic colitis. Impression:               - Non-bleeding internal hemorrhoids.                           - Diverticulosis in the sigmoid colon.                           - Two 4 to 6 mm polyps in the ascending colon,                            removed with a cold snare. Resected and retrieved.                           - Biopsies were taken with a cold forceps from the                            ascending colon, transverse colon and descending                            colon for evaluation of microscopic colitis. Moderate Sedation:  Per Anesthesia Care Recommendation:           - Patient has a contact number available for                            emergencies. The signs and symptoms of potential                            delayed complications were discussed with the                            patient. Return to normal activities tomorrow.                            Written discharge instructions were provided to the                            patient.                           - Resume previous diet.                           - Continue present medications.                            - Await pathology results.                           - Repeat colonoscopy in 5 years for surveillance.                           - Return to GI clinic in 3 months. Procedure Code(s):        --- Professional ---                           [phone], Colonoscopy, flexible; with removal of                            tumor(s), polyp(s), or other lesion(s) by snare                            technique                           45380, 75, Colonoscopy, flexible; with biopsy,                            single or multiple Diagnosis Code(s):        --- Professional ---                           K64.8, Other hemorrhoids                           K63.5, Polyp of colon  R10.30, Lower abdominal pain, unspecified                           K52.9, Noninfective gastroenteritis and colitis,                            unspecified                           K57.30, Diverticulosis of large intestine without                            perforation or abscess without bleeding CPT copyright 2019 American Medical Association. All rights reserved. The codes documented in this report are preliminary and upon coder review may  be revised to meet current compliance requirements. Elon Alas. Abbey Chatters, DO [place] Abbey Chatters, DO 03/21/2020 1:43:31 PM This report has been signed electronically. Number of Addenda: 0

## 2020-03-22 ENCOUNTER — Other Ambulatory Visit: Payer: Self-pay

## 2020-03-23 LAB — SURGICAL PATHOLOGY

## 2020-03-28 ENCOUNTER — Telehealth: Payer: Self-pay | Admitting: Internal Medicine

## 2020-03-28 ENCOUNTER — Encounter (HOSPITAL_COMMUNITY): Payer: Self-pay | Admitting: Internal Medicine

## 2020-03-28 NOTE — Progress Notes (Signed)
On recall  °

## 2020-03-28 NOTE — Telephone Encounter (Signed)
See result note.  

## 2020-03-28 NOTE — Progress Notes (Signed)
Dear Theresa Mullins  We have tried to reach you on several occasions regarding your results. Please call our office at [phone] at your earliest convenience.     Thank you, Floria Raveling, CMA

## 2020-03-28 NOTE — Telephone Encounter (Signed)
PATIENT RETURNED CALL, PLEASE CALL BACK  °

## 2020-05-03 ENCOUNTER — Telehealth: Payer: Self-pay

## 2020-06-18 ENCOUNTER — Encounter: Payer: Self-pay | Admitting: Gastroenterology

## 2020-06-18 NOTE — Progress Notes (Deleted)
Referring Provider: Hector Brunswick* Primary Care Physician:  Wannetta Sender, FNP Primary GI Physician: Dr. Abbey Chatters  No chief complaint on file.   HPI:   Theresa Mullins is a 50 y.o. female presenting today for follow-up of abdominal pain, nausea without vomiting, diarrhea, bloating s/p EGD and colonoscopy.  Last seen in our office 02/25/2020 at the time of initial consult.  She reported chronic GI issues and severe underlying anxiety which she felt attributed to her GI symptoms.  Chronic diarrhea with up to 3-4 BMs daily that are loose with intermittent urgency.  No food triggers.  No melena or hematochezia.  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  Also with chronic nausea for which she was taking Phenergan, abdominal bloating.  Previously with reflux that was maintained with a better diet.  Plan for EGD and colonoscopy.  Procedures 03/21/2020: Colonoscopy: Nonbleeding internal hemorrhoids, small mouth diverticula in the sigmoid colon, two 4-6 mm polyps in the ascending colon (tubular adenomas), s/p random colon biopsies (benign).  Repeat colonoscopy in 5 years. EGD: Normal-appearing esophagus s/p biopsy (benign), gastritis s/p biopsy (reactive gastropathy with erosion, negative H. pylori), normal examined duodenum s/p biopsied (benign).  Recommended omeprazole 20 mg twice daily x8 weeks.    Today:   Past Medical History:  Diagnosis Date  . ADD (attention deficit disorder)   . Anxiety   . Anxiety   . Carpal tunnel syndrome   . Cerebellar bleed ([place])   . Depression   . DUB (dysfunctional uterine bleeding)   . Dysmenorrhea   . High cholesterol   . Hip pain   . Hypertension   . Joint pain   . Mood disorder ([place])   . Restless legs   . Vertigo     Past Surgical History:  Procedure Laterality Date  . BIOPSY  03/21/2020   Procedure: BIOPSY;  Surgeon: Eloise Harman, DO;  Location: [facility];  Service: Endoscopy;;  . BRAIN  SURGERY    . COLONOSCOPY WITH PROPOFOL N/A 03/21/2020   Procedure: COLONOSCOPY WITH PROPOFOL;  Surgeon: Eloise Harman, DO;  Location: [facility];  Service: Endoscopy;  Laterality: N/A;  Patient CANNOT come any earlier.  . ESOPHAGOGASTRODUODENOSCOPY (EGD) WITH PROPOFOL N/A 03/21/2020   Procedure: ESOPHAGOGASTRODUODENOSCOPY (EGD) WITH PROPOFOL;  Surgeon: Eloise Harman, DO;  Location: [facility];  Service: Endoscopy;  Laterality: N/A;  . POLYPECTOMY  03/21/2020   Procedure: POLYPECTOMY;  Surgeon: Eloise Harman, DO;  Location: [facility];  Service: Endoscopy;;    Current Outpatient Medications  Medication Sig Dispense Refill  . ALPRAZolam (XANAX) 0.5 MG tablet Take 0.5 mg by mouth 2 (two) times daily.    . butalbital-acetaminophen-caffeine (FIORICET, ESGIC) 50-325-40 MG tablet Take 1 tablet by mouth every 6 (six) hours as needed for headache. 10 tablet 3  . calcium carbonate (TUMS - DOSED IN MG ELEMENTAL CALCIUM) 500 MG chewable tablet Chew 500-1,000 mg by mouth daily as needed for indigestion or heartburn.    . cetirizine (ZYRTEC) 10 MG tablet Take 10 mg by mouth daily.    . fluticasone (FLONASE) 50 MCG/ACT nasal spray Place 1 spray into both nostrils daily as needed for allergies.     Marland Kitchen gabapentin (NEURONTIN) 100 MG capsule Take 100 mg by mouth 2 (two) times daily.   0  . Galcanezumab-gnlm (EMGALITY) 120 MG/ML SOAJ Inject 120 mg into the skin every 30 (thirty) days.    Marland Kitchen gemfibrozil (  LOPID) 600 MG tablet Take 600 mg by mouth 2 (two) times daily.     . Glucosamine-Chondroitin-MSM-D3 TABS Take 1 tablet by mouth daily.    Marland Kitchen lisinopril (PRINIVIL,ZESTRIL) 10 MG tablet Take 10 mg by mouth daily.    . meclizine (ANTIVERT) 25 MG tablet Take 25 mg by mouth 4 (four) times daily as needed for dizziness.     . Nutritional Supplements (ESTROVEN PO) Take 1 tablet by mouth daily.    Marland Kitchen omeprazole (PRILOSEC) 20 MG capsule Take 1 capsule (20 mg total) by mouth 2 (two) times daily. 60 capsule  5  . OVER THE COUNTER MEDICATION Take 1 tablet by mouth daily. Brain health otc supplement    . POTASSIUM PO Take 1 tablet by mouth daily.    . promethazine (PHENERGAN) 25 MG tablet Take 25 mg by mouth every 8 (eight) hours as needed for nausea or vomiting.   0  . traZODone (DESYREL) 100 MG tablet Take 100 mg by mouth at bedtime.    Marland Kitchen venlafaxine XR (EFFEXOR-XR) 150 MG 24 hr capsule Take 150 mg by mouth daily with breakfast.    . vitamin B-12 (CYANOCOBALAMIN) 1000 MCG tablet Take 1,000 mcg by mouth daily.     No current facility-administered medications for this visit.    Allergies as of 06/20/2020 - Review Complete 03/21/2020  Allergen Reaction Noted  . Ciprofloxacin Anaphylaxis 10/15/2013  . Depakote [divalproex sodium]  02/25/2020  . Remeron [mirtazapine]  03/20/2012  . Contrast media [iodinated diagnostic agents] Rash 01/02/2016    Family History  Problem Relation Age of Onset  . Mental illness Mother   . Hypertension Mother   . Depression Mother   . CAD Mother   . Diabetes Mother   . Melanoma Mother   . Mental illness Father   . Heart attack Father   . Aneurysm Father     Social History   Socioeconomic History  . Marital status: Single    Spouse name: Not on file  . Number of children: 0  . Years of education: GED  . Highest education level: Not on file  Occupational History  . Occupation: Disabled  Tobacco Use  . Smoking status: Former Smoker    Packs/day: 0.50    Quit date: 10/22/2015    Years since quitting: 4.6  . Smokeless tobacco: Never Used  Substance and Sexual Activity  . Alcohol use: No  . Drug use: No  . Sexual activity: Not on file  Other Topics Concern  . Not on file  Social History Narrative   Lives at home with her boyfriend.   Right-handed.   No caffeine use.   Social Determinants of Health   Financial Resource Strain: Not on file  Food Insecurity: Not on file  Transportation Needs: Not on file  Physical Activity: Not on file   Stress: Not on file  Social Connections: Not on file    Review of Systems: Gen: Denies fever, chills, anorexia. Denies fatigue, weakness, weight loss.  CV: Denies chest pain, palpitations, syncope, peripheral edema, and claudication. Resp: Denies dyspnea at rest, cough, wheezing, coughing up blood, and pleurisy. GI: Denies vomiting blood, jaundice, and fecal incontinence.   Denies dysphagia or odynophagia. Derm: Denies rash, itching, dry skin Psych: Denies depression, anxiety, memory loss, confusion. No homicidal or suicidal ideation.  Heme: Denies bruising, bleeding, and enlarged lymph nodes.  Physical Exam: There were no vitals taken for this visit. General:   Alert and oriented. No distress noted. Pleasant and cooperative.  Head:  Normocephalic and atraumatic. Eyes:  Conjuctiva clear without scleral icterus. Mouth:  Oral mucosa pink and moist. Good dentition. No lesions. Heart:  S1, S2 present without murmurs appreciated. Lungs:  Clear to auscultation bilaterally. No wheezes, rales, or rhonchi. No distress.  Abdomen:  +BS, soft, non-tender and non-distended. No rebound or guarding. No HSM or masses noted. Msk:  Symmetrical without gross deformities. Normal posture. Extremities:  Without edema. Neurologic:  Alert and  oriented x4 Psych:  Alert and cooperative. Normal mood and affect.

## 2020-06-20 ENCOUNTER — Ambulatory Visit: Payer: Medicare Other | Admitting: Gastroenterology

## 2020-08-03 ENCOUNTER — Other Ambulatory Visit: Payer: Self-pay | Admitting: Internal Medicine

## 2020-10-28 ENCOUNTER — Other Ambulatory Visit: Payer: Self-pay | Admitting: Gastroenterology

## 2020-10-31 NOTE — Telephone Encounter (Signed)
Limited refills. Patient needs follow up. She should have been back down to once daily omeprazole (was increased for 8 weeks after EGD).

## 2020-11-03 NOTE — Telephone Encounter (Signed)
Phoned and LMOVM for the pt to return call 

## 2020-11-11 NOTE — Telephone Encounter (Signed)
Phoned the pt and went straight to vm, advised on vm that we needed to speak with her regarding her Rx and her needing a follow-up visit.

## 2020-11-14 NOTE — Telephone Encounter (Signed)
noted 

## 2020-11-14 NOTE — Telephone Encounter (Signed)
Phoned to the pt's pharmacy and she picked up her Rx on July 8th. I am putting a letter in the mail today to advised her we are trying to contact her regarding a visit and instructions needed to be given to her.

## 2021-01-19 ENCOUNTER — Other Ambulatory Visit: Payer: Self-pay | Admitting: Gastroenterology

## 2021-04-16 ENCOUNTER — Other Ambulatory Visit: Payer: Self-pay | Admitting: Gastroenterology

## 2021-10-06 ENCOUNTER — Other Ambulatory Visit: Payer: Self-pay | Admitting: Gastroenterology

## 2022-03-30 ENCOUNTER — Other Ambulatory Visit: Payer: Self-pay | Admitting: Internal Medicine

## 2023-06-16 ENCOUNTER — Emergency Department (HOSPITAL_COMMUNITY): Payer: 59

## 2023-06-16 ENCOUNTER — Emergency Department (HOSPITAL_COMMUNITY)
Admission: EM | Admit: 2023-06-16 | Discharge: 2023-06-16 | Disposition: A | Discharge status: Home / Self Care | Payer: 59 | Attending: Emergency Medicine | Admitting: Emergency Medicine

## 2023-06-16 ENCOUNTER — Encounter (HOSPITAL_COMMUNITY): Payer: Self-pay

## 2023-06-16 ENCOUNTER — Other Ambulatory Visit: Payer: Self-pay

## 2023-06-16 DIAGNOSIS — Z79899 Other long term (current) drug therapy: Secondary | ICD-10-CM | POA: Insufficient documentation

## 2023-06-16 DIAGNOSIS — J45901 Unspecified asthma with (acute) exacerbation: Secondary | ICD-10-CM | POA: Diagnosis not present

## 2023-06-16 DIAGNOSIS — R0602 Shortness of breath: Secondary | ICD-10-CM | POA: Diagnosis present

## 2023-06-16 DIAGNOSIS — J101 Influenza due to other identified influenza virus with other respiratory manifestations: Secondary | ICD-10-CM

## 2023-06-16 LAB — COMPREHENSIVE METABOLIC PANEL
ALT: 21 U/L (ref 0–44)
AST: 28 U/L (ref 15–41)
Albumin: 3.6 g/dL (ref 3.5–5.0)
Alkaline Phosphatase: 42 U/L (ref 38–126)
Anion gap: 10 (ref 5–15)
BUN: 11 mg/dL (ref 6–20)
CO2: 25 mmol/L (ref 22–32)
Calcium: 8.3 mg/dL — ABNORMAL LOW (ref 8.9–10.3)
Chloride: 98 mmol/L (ref 98–111)
Creatinine, Ser: 0.62 mg/dL (ref 0.44–1.00)
GFR, Estimated: >60 mL/min (ref >60)
Glucose, Bld: 99 mg/dL (ref 70–99)
Potassium: 3.4 mmol/L — ABNORMAL LOW (ref 3.5–5.1)
Sodium: 133 mmol/L — ABNORMAL LOW (ref 135–145)
Total Bilirubin: 0.4 mg/dL (ref 0.0–1.2)
Total Protein: 7.2 g/dL (ref 6.5–8.1)

## 2023-06-16 LAB — CBC WITH DIFFERENTIAL/PLATELET
Abs Immature Granulocytes: 0.04 10*3/uL (ref 0.00–0.07)
Basophils Absolute: 0 10*3/uL (ref 0.0–0.1)
Basophils Relative: 1 %
Eosinophils Absolute: 0 10*3/uL (ref 0.0–0.5)
Eosinophils Relative: 0 %
HCT: 37.7 % (ref 36.0–46.0)
Hemoglobin: 12.9 g/dL (ref 12.0–15.0)
Immature Granulocytes: 1 %
Lymphocytes Relative: 15 %
Lymphs Abs: 0.6 10*3/uL — ABNORMAL LOW (ref 0.7–4.0)
MCH: 34.7 pg — ABNORMAL HIGH (ref 26.0–34.0)
MCHC: 34.2 g/dL (ref 30.0–36.0)
MCV: 101.3 fL — ABNORMAL HIGH (ref 80.0–100.0)
Monocytes Absolute: 0.3 10*3/uL (ref 0.1–1.0)
Monocytes Relative: 7 %
Neutro Abs: 3.2 10*3/uL (ref 1.7–7.7)
Neutrophils Relative %: 76 %
Platelets: 193 10*3/uL (ref 150–400)
RBC: 3.72 MIL/uL — ABNORMAL LOW (ref 3.87–5.11)
RDW: 12.2 % (ref 11.5–15.5)
WBC: 4.1 10*3/uL (ref 4.0–10.5)
nRBC: 0 % (ref 0.0–0.2)

## 2023-06-16 LAB — TROPONIN I (HIGH SENSITIVITY): Troponin I (High Sensitivity): 3 ng/L (ref <18)

## 2023-06-16 LAB — RESP PANEL BY RT-PCR (RSV, FLU A&B, COVID)  RVPGX2
Influenza A by PCR: POSITIVE — AB
Influenza B by PCR: NEGATIVE
Resp Syncytial Virus by PCR: NEGATIVE
SARS Coronavirus 2 by RT PCR: NEGATIVE

## 2023-06-16 MED ORDER — METHYLPREDNISOLONE SODIUM SUCC 125 MG IJ SOLR
125.0000 mg | Freq: Once | INTRAMUSCULAR | Status: AC
  Administered 2023-06-16: 125 mg via INTRAVENOUS
  Filled 2023-06-16: qty 2

## 2023-06-16 MED ORDER — IPRATROPIUM-ALBUTEROL 0.5-2.5 (3) MG/3ML IN SOLN
3.0000 mL | Freq: Once | RESPIRATORY_TRACT | Status: AC
  Administered 2023-06-16: 3 mL via RESPIRATORY_TRACT
  Filled 2023-06-16: qty 3

## 2023-06-16 MED ORDER — ALBUTEROL SULFATE (2.5 MG/3ML) 0.083% IN NEBU
10.0000 mg/h | INHALATION_SOLUTION | Freq: Once | RESPIRATORY_TRACT | Status: AC
  Administered 2023-06-16: 10 mg/h via RESPIRATORY_TRACT
  Filled 2023-06-16: qty 12

## 2023-06-16 MED ORDER — PROMETHAZINE-DM 6.25-15 MG/5ML PO SYRP: 5.0000 mL | ORAL_SOLUTION | Freq: Four times a day (QID) | ORAL | 0 refills | Status: DC | PRN

## 2023-06-16 MED ORDER — PREDNISONE 10 MG PO TABS: 40.0000 mg | ORAL_TABLET | Freq: Every day | ORAL | 0 refills | Status: AC

## 2023-06-16 MED ORDER — KETOROLAC TROMETHAMINE 15 MG/ML IJ SOLN
15.0000 mg | Freq: Once | INTRAMUSCULAR | Status: AC
  Administered 2023-06-16: 15 mg via INTRAVENOUS
  Filled 2023-06-16: qty 1

## 2023-06-16 NOTE — ED Notes (Signed)
 Pt ambulated around ED, tolerated well, denies dizziness and increased SOB. Pt sats maintained at 92% or greater. Pt ambulated with minimal assistance.

## 2023-06-16 NOTE — ED Triage Notes (Signed)
 Pt c/o SOB increasing the past 2-3 days. Pt states whole house has been sick for a week and that she was seen at the [facility] and tested negative for everything. Pt states chest pain in LL rib area when coughing. Pt states takes her rescue inhaler with some relief but not really.

## 2023-06-16 NOTE — Discharge Instructions (Signed)
 Please begin taking the prednisone tomorrow.  Return to the emergency department immediately for any new or worsening symptoms.  Follow-up closely with your primary care doctor on an outpatient basis.

## 2023-06-16 NOTE — ED Provider Notes (Signed)
 [place] EMERGENCY DEPARTMENT AT [facility] Provider Note   CSN: [id number] Arrival date & time: 06/16/23  1139     History  Chief Complaint  Patient presents with   Shortness of Breath    Theresa Mullins is a 53 y.o. female.  Patient is a 53 year old female who presents to the emergency department the chief complaint of cough, congestion, shortness of breath which has been ongoing for approximate the past 4 days.  She does note that she has been exposed to other members in her family who have been sick with similar symptoms.  Patient notes that she has been using her home breathing treatments with only minimal improvement in her symptoms.  Patient notes that she has had no associated chest pain, abdominal pain, nausea, vomiting, diarrhea.  She denies any fever or chills.  She denies any associated dizziness, lightheadedness or syncope.  She denies any associated edema.   Shortness of Breath Associated symptoms: cough and wheezing        Home Medications Prior to Admission medications   Medication Sig Start Date End Date Taking? Authorizing Provider  ALPRAZolam Prudy Feeler) 0.5 MG tablet Take 0.5 mg by mouth 2 (two) times daily. 02/08/20   [provider]  butalbital-acetaminophen-caffeine (FIORICET, ESGIC) 50-325-40 MG tablet Take 1 tablet by mouth every 6 (six) hours as needed for headache. 12/07/15   Levert Feinstein, MD  calcium carbonate (TUMS - DOSED IN MG ELEMENTAL CALCIUM) 500 MG chewable tablet Chew 500-1,000 mg by mouth daily as needed for indigestion or heartburn.    [provider]  cetirizine (ZYRTEC) 10 MG tablet Take 10 mg by mouth daily.    [provider]  fluticasone (FLONASE) 50 MCG/ACT nasal spray Place 1 spray into both nostrils daily as needed for allergies.     [provider]  gabapentin (NEURONTIN) 100 MG capsule Take 100 mg by mouth 2 (two) times daily.  01/09/17   [provider]  Galcanezumab-gnlm (EMGALITY) 120  MG/ML SOAJ Inject 120 mg into the skin every 30 (thirty) days.    [provider]  gemfibrozil (LOPID) 600 MG tablet Take 600 mg by mouth 2 (two) times daily.     [provider]  Glucosamine-Chondroitin-MSM-D3 TABS Take 1 tablet by mouth daily.    [provider]  lisinopril (PRINIVIL,ZESTRIL) 10 MG tablet Take 10 mg by mouth daily.    [provider]  meclizine (ANTIVERT) 25 MG tablet Take 25 mg by mouth 4 (four) times daily as needed for dizziness.     [provider]  Nutritional Supplements (ESTROVEN PO) Take 1 tablet by mouth daily.    [provider]  omeprazole (PRILOSEC) 20 MG capsule TAKE ONE CAPSULE BY MOUTH TWICE A DAY 10/11/21   Earnest Bailey K, DO  OVER THE COUNTER MEDICATION Take 1 tablet by mouth daily. Brain health otc supplement    [provider]  POTASSIUM PO Take 1 tablet by mouth daily.    [provider]  promethazine (PHENERGAN) 25 MG tablet Take 25 mg by mouth every 8 (eight) hours as needed for nausea or vomiting.  09/09/16   [provider]  traZODone (DESYREL) 100 MG tablet Take 100 mg by mouth at bedtime.    [provider]  venlafaxine XR (EFFEXOR-XR) 150 MG 24 hr capsule Take 150 mg by mouth daily with breakfast.    [provider]  vitamin B-12 (CYANOCOBALAMIN) 1000 MCG tablet Take 1,000 mcg by mouth daily.  [provider]      Allergies    Ciprofloxacin, Depakote [divalproex sodium], Remeron [mirtazapine], and Contrast media [iodinated contrast media]    Review of Systems   Review of Systems  Respiratory:  Positive for cough, shortness of breath and wheezing.   All other systems reviewed and are negative.   Physical Exam Updated Vital Signs BP 125/80   Pulse 98   Temp 97.9 F (36.6 C) (Oral)   Resp 17   Ht 4\' 7"  (1.397 m)   Wt 71.7 kg   SpO2 98%   BMI 36.74 kg/m  Physical Exam Vitals reviewed.  Constitutional:      Appearance: Normal  appearance.  HENT:     Head: Normocephalic and atraumatic.     Nose: Nose normal.     Mouth/Throat:     Mouth: Mucous membranes are moist.  Eyes:     Extraocular Movements: Extraocular movements intact.     Conjunctiva/sclera: Conjunctivae normal.     Pupils: Pupils are equal, round, and reactive to light.  Cardiovascular:     Rate and Rhythm: Normal rate and regular rhythm.     Pulses: Normal pulses.     Heart sounds: Normal heart sounds.  Pulmonary:     Effort: Pulmonary effort is normal. Tachypnea present. No respiratory distress.     Breath sounds: Normal breath sounds.     Comments: Diffuse expiratory wheezing Abdominal:     General: Abdomen is flat. Bowel sounds are normal.     Palpations: Abdomen is soft.     Tenderness: There is no abdominal tenderness. There is no guarding.  Musculoskeletal:        General: Normal range of motion.     Cervical back: Normal range of motion and neck supple.     Right lower leg: No edema.     Left lower leg: Edema present.  Skin:    General: Skin is warm and dry.  Neurological:     General: No focal deficit present.     Mental Status: She is alert and oriented to person, place, and time. Mental status is at baseline.  Psychiatric:        Mood and Affect: Mood normal.        Behavior: Behavior normal.        Thought Content: Thought content normal.        Judgment: Judgment normal.     ED Results / Procedures / Treatments   Labs (all labs ordered are listed, but only abnormal results are displayed) Labs Reviewed  RESP PANEL BY RT-PCR (RSV, FLU A&B, COVID)  RVPGX2 - Abnormal; Notable for the following components:      Result Value   Influenza A by PCR POSITIVE (*)    All other components within normal limits  COMPREHENSIVE METABOLIC PANEL - Abnormal; Notable for the following components:   Sodium 133 (*)    Potassium 3.4 (*)    Calcium 8.3 (*)    All other components within normal limits  CBC WITH DIFFERENTIAL/PLATELET -  Abnormal; Notable for the following components:   RBC 3.72 (*)    MCV 101.3 (*)    MCH 34.7 (*)    Lymphs Abs 0.6 (*)    All other components within normal limits  TROPONIN I (HIGH SENSITIVITY)    EKG None  Radiology DG Chest Port 1 View Result Date: 06/16/2023 CLINICAL DATA:  Cough EXAM: PORTABLE CHEST 1 VIEW COMPARISON:  12/24/2018 FINDINGS: The heart size and mediastinal contours  are within normal limits. Both lungs are clear. The visualized skeletal structures are unremarkable. IMPRESSION: No active disease. Electronically Signed   By: Duanne Guess D.O.   On: 06/16/2023 13:08    Procedures Procedures    Medications Ordered in ED Medications  albuterol (PROVENTIL) (2.5 MG/3ML) 0.083% nebulizer solution (10 mg/hr Nebulization Given 06/16/23 1226)  methylPREDNISolone sodium succinate (SOLU-MEDROL) 125 mg/2 mL injection 125 mg (125 mg Intravenous Given 06/16/23 1213)  ketorolac (TORADOL) 15 MG/ML injection 15 mg (15 mg Intravenous Given 06/16/23 1409)  ipratropium-albuterol (DUONEB) 0.5-2.5 (3) MG/3ML nebulizer solution 3 mL (3 mLs Nebulization Given 06/16/23 1429)  ipratropium-albuterol (DUONEB) 0.5-2.5 (3) MG/3ML nebulizer solution 3 mL (3 mLs Nebulization Given 06/16/23 1429)    ED Course/ Medical Decision Making/ A&P                                 Medical Decision Making Amount and/or Complexity of Data Reviewed Labs: ordered. Radiology: ordered.  Risk Prescription drug management.   This patient presents to the ED for concern of cough, shortness of breath differential diagnosis includes acute viral syndrome, COPD, asthma, ACS, pneumonia, CHF    Additional history obtained:  Additional history obtained from medical records External records from outside source obtained and reviewed including none   Lab Tests:  I Ordered, and personally interpreted labs.  The pertinent results include: Influenza A positive   Imaging Studies ordered:  I ordered imaging  studies including chest x-ray I independently visualized and interpreted imaging which showed no acute cardiopulmonary process I agree with the radiologist interpretation   Medicines ordered and prescription drug management:  I ordered medication including Solu-Medrol, DuoNeb, albuterol, Toradol for wheezing, influenza Reevaluation of the patient after these medicines showed that the patient improved I have reviewed the patients home medicines and have made adjustments as needed   Problem List / ED Course:  Patient is doing much better at this time and is stable for discharge home.  Wheezing has almost completely resolved with treatment in the emergency department and patient has been able to ambulate throughout the ED with no desaturations.  Patient had no increased shortness of breath with ambulation and notes that she does feel stable for discharge home at this time.  Discussed with patient she is positive for influenza A but is outside the window for Tamiflu at this point.  Patient has stable vital signs at this point with no other indication for sepsis and no associated hypoxia.  Chest x-ray demonstrated no indication for pneumonia.  EKG demonstrated no acute ischemic changes and troponin was negative.  The need for close follow-up with her primary care doctor on an outpatient basis was discussed as well as strict turn precautions for any new or worsening symptoms.  Patient voiced understand to the plan and had no additional questions.  Will continue outpatient steroids at this point and patient was directed to begin taking these tomorrow.   Social Determinants of Health:  None           Final Clinical Impression(s) / ED Diagnoses Final diagnoses:  None    Rx / DC Orders ED Discharge Orders     None         Kathlen Mody 06/16/23 1522    Benjiman Core, MD 06/16/23 [phone]

## 2023-06-16 NOTE — ED Notes (Signed)
 Pt ambulated approximately 50 feet oxygen saturation stayed 92% RA.

## 2023-08-08 ENCOUNTER — Encounter (HOSPITAL_COMMUNITY): Payer: Self-pay | Admitting: *Deleted

## 2023-08-08 ENCOUNTER — Other Ambulatory Visit: Payer: Self-pay

## 2023-08-08 ENCOUNTER — Emergency Department (HOSPITAL_COMMUNITY)
Admission: EM | Admit: 2023-08-08 | Discharge: 2023-08-08 | Disposition: A | Discharge status: Home / Self Care | Attending: Emergency Medicine | Admitting: Emergency Medicine

## 2023-08-08 ENCOUNTER — Emergency Department (HOSPITAL_COMMUNITY)

## 2023-08-08 DIAGNOSIS — Z79899 Other long term (current) drug therapy: Secondary | ICD-10-CM | POA: Insufficient documentation

## 2023-08-08 DIAGNOSIS — I700 Atherosclerosis of aorta: Secondary | ICD-10-CM | POA: Diagnosis not present

## 2023-08-08 DIAGNOSIS — M545 Low back pain, unspecified: Secondary | ICD-10-CM | POA: Insufficient documentation

## 2023-08-08 DIAGNOSIS — I10 Essential (primary) hypertension: Secondary | ICD-10-CM | POA: Insufficient documentation

## 2023-08-08 DIAGNOSIS — M48061 Spinal stenosis, lumbar region without neurogenic claudication: Secondary | ICD-10-CM | POA: Insufficient documentation

## 2023-08-08 DIAGNOSIS — M51369 Other intervertebral disc degeneration, lumbar region without mention of lumbar back pain or lower extremity pain: Secondary | ICD-10-CM | POA: Diagnosis not present

## 2023-08-08 DIAGNOSIS — R109 Unspecified abdominal pain: Secondary | ICD-10-CM | POA: Insufficient documentation

## 2023-08-08 LAB — CBC WITH DIFFERENTIAL/PLATELET
Abs Immature Granulocytes: 0.02 10*3/uL (ref 0.00–0.07)
Basophils Absolute: 0 10*3/uL (ref 0.0–0.1)
Basophils Relative: 1 %
Eosinophils Absolute: 0.1 10*3/uL (ref 0.0–0.5)
Eosinophils Relative: 1 %
HCT: 38.7 % (ref 36.0–46.0)
Hemoglobin: 13 g/dL (ref 12.0–15.0)
Immature Granulocytes: 0 %
Lymphocytes Relative: 30 %
Lymphs Abs: 1.7 10*3/uL (ref 0.7–4.0)
MCH: 34 pg (ref 26.0–34.0)
MCHC: 33.6 g/dL (ref 30.0–36.0)
MCV: 101.3 fL — ABNORMAL HIGH (ref 80.0–100.0)
Monocytes Absolute: 0.3 10*3/uL (ref 0.1–1.0)
Monocytes Relative: 4 %
Neutro Abs: 3.7 10*3/uL (ref 1.7–7.7)
Neutrophils Relative %: 64 %
Platelets: 261 10*3/uL (ref 150–400)
RBC: 3.82 MIL/uL — ABNORMAL LOW (ref 3.87–5.11)
RDW: 11.9 % (ref 11.5–15.5)
WBC: 5.8 10*3/uL (ref 4.0–10.5)
nRBC: 0 % (ref 0.0–0.2)

## 2023-08-08 LAB — COMPREHENSIVE METABOLIC PANEL WITH GFR
ALT: 17 U/L (ref 0–44)
AST: 19 U/L (ref 15–41)
Albumin: 3.8 g/dL (ref 3.5–5.0)
Alkaline Phosphatase: 46 U/L (ref 38–126)
Anion gap: 10 (ref 5–15)
BUN: 10 mg/dL (ref 6–20)
CO2: 28 mmol/L (ref 22–32)
Calcium: 9.1 mg/dL (ref 8.9–10.3)
Chloride: 99 mmol/L (ref 98–111)
Creatinine, Ser: 0.61 mg/dL (ref 0.44–1.00)
GFR, Estimated: >60 mL/min (ref >60)
Glucose, Bld: 90 mg/dL (ref 70–99)
Potassium: 3.4 mmol/L — ABNORMAL LOW (ref 3.5–5.1)
Sodium: 137 mmol/L (ref 135–145)
Total Bilirubin: 0.5 mg/dL (ref 0.0–1.2)
Total Protein: 6.9 g/dL (ref 6.5–8.1)

## 2023-08-08 LAB — URINALYSIS, ROUTINE W REFLEX MICROSCOPIC
Bilirubin Urine: NEGATIVE
Glucose, UA: NEGATIVE mg/dL
Hgb urine dipstick: NEGATIVE
Ketones, ur: NEGATIVE mg/dL
Leukocytes,Ua: NEGATIVE
Nitrite: NEGATIVE
Protein, ur: NEGATIVE mg/dL
Specific Gravity, Urine: 1.003 — ABNORMAL LOW (ref 1.005–1.030)
pH: 7 (ref 5.0–8.0)

## 2023-08-08 MED ORDER — METHOCARBAMOL 500 MG PO TABS: 500.0000 mg | ORAL_TABLET | Freq: Two times a day (BID) | ORAL | 0 refills | Status: AC

## 2023-08-08 MED ORDER — KETOROLAC TROMETHAMINE 15 MG/ML IJ SOLN
15.0000 mg | Freq: Once | INTRAMUSCULAR | Status: AC
  Administered 2023-08-08: 15 mg via INTRAVENOUS
  Filled 2023-08-08: qty 1

## 2023-08-08 MED ORDER — LIDOCAINE 5 % EX PTCH: 1.0000 | MEDICATED_PATCH | CUTANEOUS | 0 refills | Status: AC

## 2023-08-08 MED ORDER — METHOCARBAMOL 500 MG PO TABS
500.0000 mg | ORAL_TABLET | Freq: Once | ORAL | Status: AC
  Administered 2023-08-08: 500 mg via ORAL
  Filled 2023-08-08: qty 1

## 2023-08-08 MED ORDER — IBUPROFEN 600 MG PO TABS: 600.0000 mg | ORAL_TABLET | Freq: Three times a day (TID) | ORAL | 0 refills | Status: AC | PRN

## 2023-08-08 NOTE — ED Provider Notes (Signed)
 [place] EMERGENCY DEPARTMENT AT [facility] Provider Note   CSN: [id number] Arrival date & time: 08/08/23  1156     History  Chief Complaint  Patient presents with   Back Pain    Theresa Mullins is a 53 y.o. female.  She has PMH of colitis, hypertension, anxiety.  Presents to the ER for left low back pain radiating into her lower abdomen, states started with some mild pain over a month ago.  It started after she had a fall onto her left knee and thought she had "jarred her back".  Over the past 4 days has become more severe pain, rating into her abdomen, associated with having to strain to empty her bladder and pressure in her suprapubic area, she denies fevers or chills, denies nausea or vomiting, denies any direct trauma to the back, no saddle anesthesia or paresthesia, no bowel or bladder incontinence, no difficulty with ambulation.   Back Pain      Home Medications Prior to Admission medications   Medication Sig Start Date End Date Taking? Authorizing Provider  albuterol  (VENTOLIN  HFA) 108 (90 Base) MCG/ACT inhaler Inhale 2 puffs into the lungs every 6 (six) hours as needed for wheezing or shortness of breath.   Yes [provider]  atorvastatin (LIPITOR) 40 MG tablet Take 40 mg by mouth daily.   Yes [provider]  BREZTRI AEROSPHERE 160-9-4.8 MCG/ACT AERO inhaler Inhale 2 puffs into the lungs 2 (two) times daily.   Yes [provider]  butalbital -acetaminophen -caffeine  (FIORICET, ESGIC) 50-325-40 MG tablet Take 1 tablet by mouth every 6 (six) hours as needed for headache. 12/07/15  Yes Phebe Brasil, MD  calcium carbonate (TUMS - DOSED IN MG ELEMENTAL CALCIUM) 500 MG chewable tablet Chew 500-1,000 mg by mouth daily as needed for indigestion or heartburn.   Yes [provider]  CAPLYTA 21 MG CAPS Take 1 capsule by mouth daily.   Yes [provider]  cetirizine (ZYRTEC) 10 MG tablet Take 10 mg by mouth daily.   Yes [provider]  clonazePAM (KLONOPIN) 0.5 MG tablet Take 0.5 mg by mouth 3 (three) times daily as needed for anxiety. Take 1 tablet and 2 tablet every evening   Yes [provider]  fluticasone (FLONASE) 50 MCG/ACT nasal spray Place 1 spray into both nostrils daily as needed for allergies.    Yes [provider]  gabapentin (NEURONTIN) 300 MG capsule Take 300 mg by mouth See admin instructions. Take 2 ( 600 mg) capsules every evening   Yes [provider]  Galcanezumab-gnlm (EMGALITY) 120 MG/ML SOAJ Inject 120 mg into the skin every 30 (thirty) days.   Yes [provider]  ibuprofen  (ADVIL ) 800 MG tablet 1 tablet with food or milk as needed Orally every 6 hours as needed   Yes [provider]  lisinopril  (PRINIVIL ,ZESTRIL ) 10 MG tablet Take 10 mg by mouth daily.   Yes [provider]  meclizine  (ANTIVERT ) 25 MG tablet Take 25 mg by mouth 4 (four) times daily as needed for dizziness.    Yes [provider]  Nutritional Supplements (ESTROVEN PO) Take 1 tablet by mouth daily.   Yes [provider]  omeprazole  (PRILOSEC) 20 MG capsule TAKE ONE CAPSULE BY MOUTH TWICE A DAY 10/11/21  Yes Carver, Charles K, DO  promethazine  (PHENERGAN ) 25 MG tablet Take 25 mg by mouth every 8 (eight) hours as needed for nausea or vomiting.  09/09/16  Yes [provider]  traZODone (DESYREL)  100 MG tablet Take 100 mg by mouth at bedtime.   Yes [provider]  venlafaxine XR (EFFEXOR-XR) 150 MG 24 hr capsule Take 150 mg by mouth daily with breakfast.   Yes [provider]  amoxicillin-clavulanate (AUGMENTIN) 875-125 MG tablet SMARTSIG:1 Tablet(s) By Mouth Every 12 Hours Patient not taking: Reported on 08/08/2023 07/06/23   [provider]  sulfamethoxazole-trimethoprim (BACTRIM DS) 800-160 MG tablet 1 tablet Orally bid for 5 days Patient not taking: Reported on 08/08/2023 07/19/22   [provider]       Allergies    Ciprofloxacin , Depakote  [divalproex  sodium], Remeron [mirtazapine], and Contrast media [iodinated contrast media]    Review of Systems   Review of Systems  Musculoskeletal:  Positive for back pain.    Physical Exam Updated Vital Signs BP (!) 161/85 (BP Location: Right Arm)   Pulse 85   Temp 97.6 F (36.4 C) (Temporal)   Resp 18   Ht 4\' 6"  (1.372 m)   Wt 71.2 kg   LMP 12/22/2015 (Approximate) Comment: pt stated possible menopausal neg preg test 10.03.2017  SpO2 96%   BMI 37.85 kg/m  Physical Exam Vitals and nursing note reviewed.  Constitutional:      General: She is not in acute distress.    Appearance: She is well-developed.  HENT:     Head: Normocephalic and atraumatic.  Eyes:     Extraocular Movements: Extraocular movements intact.     Conjunctiva/sclera: Conjunctivae normal.     Pupils: Pupils are equal, round, and reactive to light.  Cardiovascular:     Rate and Rhythm: Normal rate and regular rhythm.     Heart sounds: No murmur heard. Pulmonary:     Effort: Pulmonary effort is normal. No respiratory distress.     Breath sounds: Normal breath sounds.  Abdominal:     Palpations: Abdomen is soft.     Tenderness: There is no abdominal tenderness.  Musculoskeletal:        General: No swelling.     Cervical back: Neck supple.  Skin:    General: Skin is warm and dry.     Capillary Refill: Capillary refill takes less than 2 seconds.  Neurological:     General: No focal deficit present.     Mental Status: She is alert and oriented to person, place, and time.  Psychiatric:        Mood and Affect: Mood normal.     ED Results / Procedures / Treatments   Labs (all labs ordered are listed, but only abnormal results are displayed) Labs Reviewed  COMPREHENSIVE METABOLIC PANEL WITH GFR - Abnormal; Notable for the following components:      Result Value   Potassium 3.4 (*)    All other components within normal limits  CBC WITH DIFFERENTIAL/PLATELET  - Abnormal; Notable for the following components:   RBC 3.82 (*)    MCV 101.3 (*)    All other components within normal limits  URINALYSIS, ROUTINE W REFLEX MICROSCOPIC    EKG None  Radiology No results found.  Procedures Procedures    Medications Ordered in ED Medications  ketorolac  (TORADOL ) 15 MG/ML injection 15 mg (has no administration in time range)    ED Course/ Medical Decision Making/ A&P                                 Medical Decision Making This patient presents to the ED for concern  of low back pain and lower abdominal pain, this involves an extensive number of treatment options, and is a complaint that carries with it a high risk of complications and morbidity.  The differential diagnosis includes muscle strain, HNP, sciatica, degenerative disc disease, ureterolithiasis, pyelonephritis, diverticulitis, psoas abscess, epidural abscess, other   Co morbidities that complicate the patient evaluation :   Hypertension   Additional history obtained:  Additional history obtained from EMR External records from outside source obtained and reviewed including prior notes and labs   Lab Tests:  I Ordered, and personally interpreted labs.  The pertinent results include: CBC shows no leukocytosis, normal hemoglobin, CMP overall reassuring, potassium 3.4, normal renal function, normal sodium chloride .  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  No chest pain, shortness of breath, no fevers or chills, no weight loss.  Normal strength in her lower extremities.  No radiation to her legs.  Imaging is reassuring but shows chronic degenerative changes, patient informed of findings advised on follow-up and return precautions.  She felt better after Toradol  and methocarbamol , will send home with NSAIDs muscle relaxers.  She was given strict return precautions.  Reevaluation of the patient after these medicines showed that the patient improved I have reviewed the patients home medicines and have made adjustments as needed     Amount and/or Complexity of Data Reviewed Labs: ordered. Radiology: ordered.  Risk Prescription drug management.           Final Clinical Impression(s) / ED Diagnoses Final diagnoses:  None    Rx / DC Orders ED Discharge Orders     None         Joshua Nieves 08/08/23 2140    Cheyenne Cotta, MD 08/09/23 [phone]

## 2023-08-08 NOTE — Discharge Instructions (Addendum)
 You were seen in the ER today for low back pain and lower abdominal pain.  Your blood work and urinalysis were reassuring.  Your CT did not show any kidney stones, no diverticulitis or other acute intra-abdominal findings but you do have significant nerve changes in your back which is likely the cause of the pain, exacerbated by your recent fall.  We are to treat with muscle relaxers, anti-inflammatories and lidocaine patches.  Please follow-up close with your PCP and/or the back specialist.  Come back to the ER if you have new or worsening symptoms.

## 2023-08-08 NOTE — ED Triage Notes (Signed)
 Pt with left lower back pain that radiates around to abd , ongoing for a month. Denies any pain with urination but does with having a bowel movement.  Diarrhea at times
